# Patient Record
Sex: Female | Born: 1970 | Race: White | Hispanic: No | Marital: Married | Smoking: Never smoker
Health system: Southern US, Community
[De-identification: ages and names within clinical notes are randomized; demographics above are authoritative.]

## PROBLEM LIST (undated history)

## (undated) DIAGNOSIS — Z808 Family history of malignant neoplasm of other organs or systems: Secondary | ICD-10-CM

## (undated) DIAGNOSIS — Z803 Family history of malignant neoplasm of breast: Secondary | ICD-10-CM

## (undated) DIAGNOSIS — Z800 Family history of malignant neoplasm of digestive organs: Secondary | ICD-10-CM

## (undated) DIAGNOSIS — Q510 Agenesis and aplasia of uterus: Secondary | ICD-10-CM

## (undated) DIAGNOSIS — J45909 Unspecified asthma, uncomplicated: Secondary | ICD-10-CM

## (undated) HISTORY — DX: Family history of malignant neoplasm of digestive organs: Z80.0

## (undated) HISTORY — DX: Family history of malignant neoplasm of other organs or systems: Z80.8

## (undated) HISTORY — DX: Family history of malignant neoplasm of breast: Z80.3

---

## 2020-04-29 ENCOUNTER — Other Ambulatory Visit: Payer: Self-pay | Admitting: Physician Assistant

## 2020-04-29 DIAGNOSIS — Z1231 Encounter for screening mammogram for malignant neoplasm of breast: Secondary | ICD-10-CM

## 2020-06-07 ENCOUNTER — Ambulatory Visit: Payer: Self-pay

## 2020-08-29 ENCOUNTER — Other Ambulatory Visit: Payer: Self-pay

## 2020-08-29 ENCOUNTER — Ambulatory Visit
Admission: RE | Admit: 2020-08-29 | Discharge: 2020-08-29 | Disposition: A | Discharge status: Home / Self Care | Payer: Federal, State, Local not specified - PPO | Source: Ambulatory Visit | Attending: Physician Assistant | Admitting: Physician Assistant

## 2020-08-29 DIAGNOSIS — Z1231 Encounter for screening mammogram for malignant neoplasm of breast: Secondary | ICD-10-CM

## 2020-08-29 IMAGING — MG DIGITAL SCREENING BILAT W/ CAD
4 series · 4 of 4 positions shown · non-contrast
Comparison: None.

CLINICAL DATA: Screening.

EXAM:
DIGITAL SCREENING BILATERAL MAMMOGRAM WITH CAD
TECHNIQUE: Bilateral screening digital craniocaudal and mediolateral oblique
mammograms were obtained. The images were evaluated with
computer-aided detection.

[R CC]
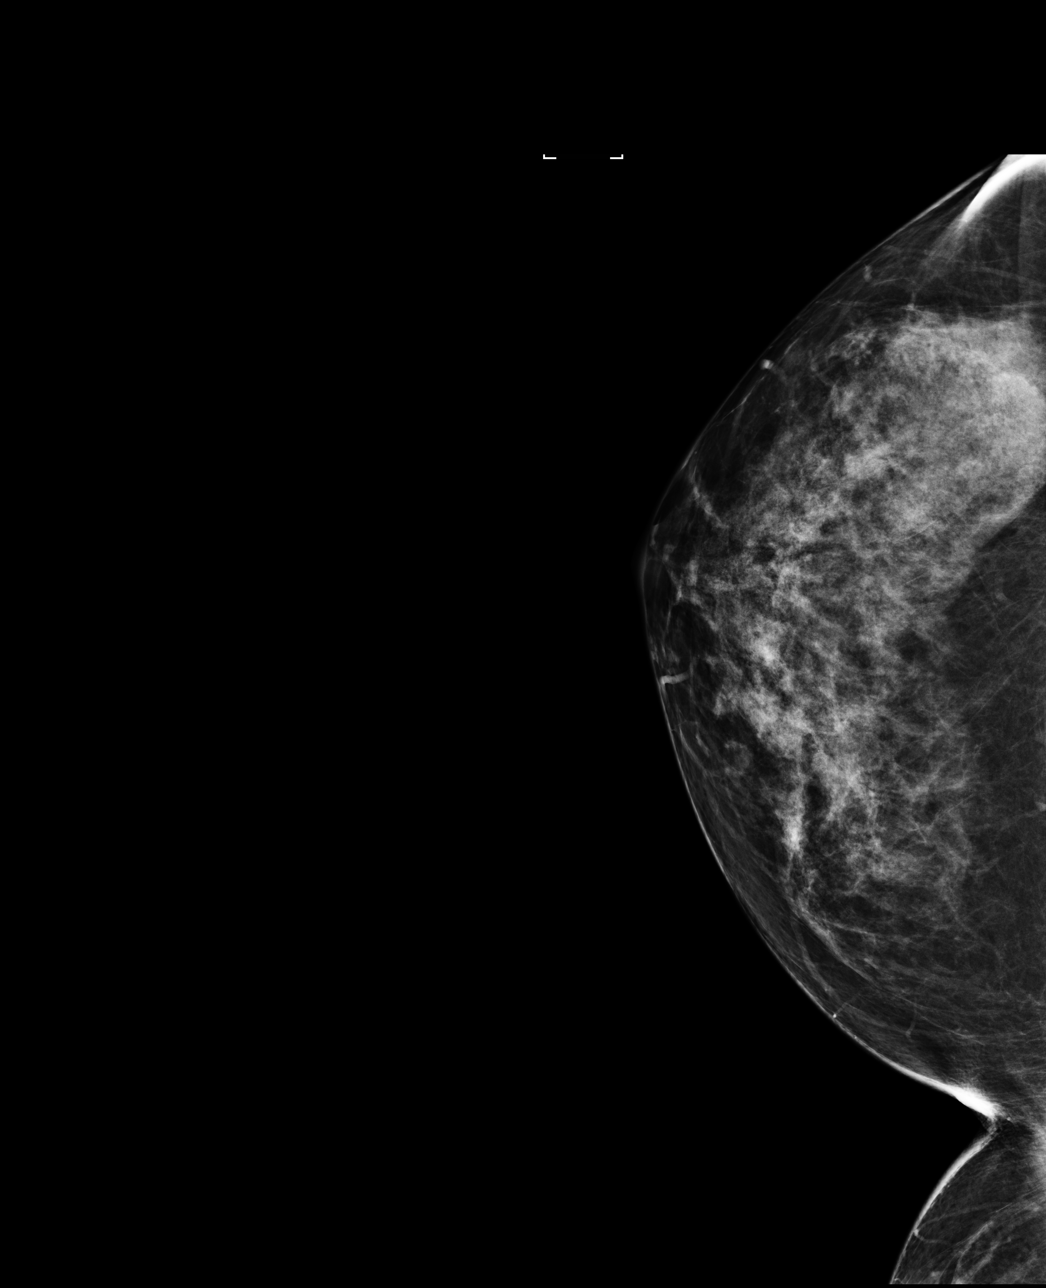

[L MLO]
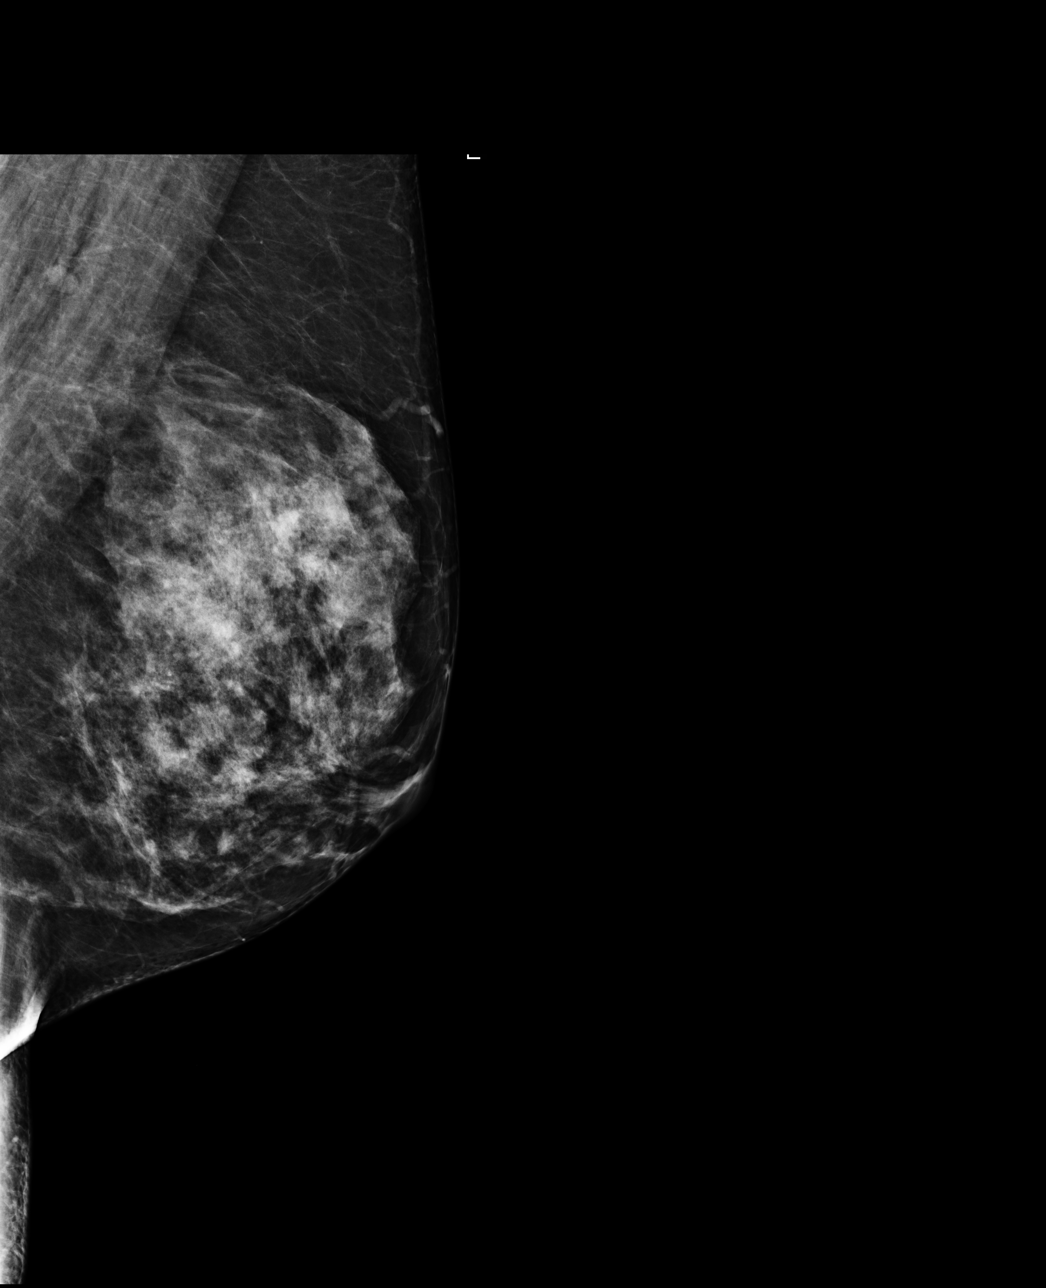

[R MLO]
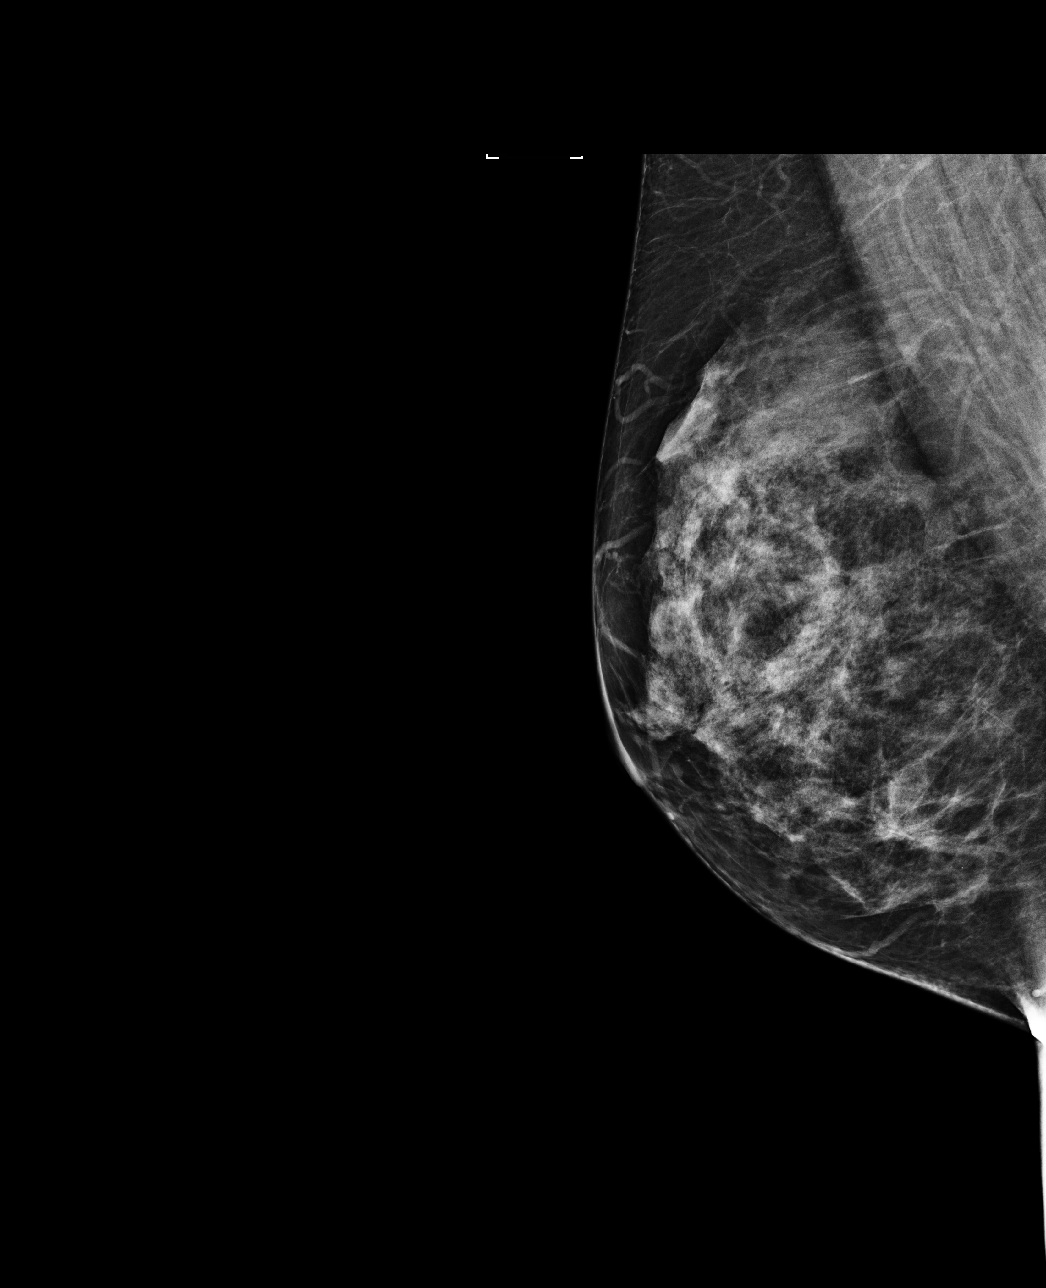

[L CC]
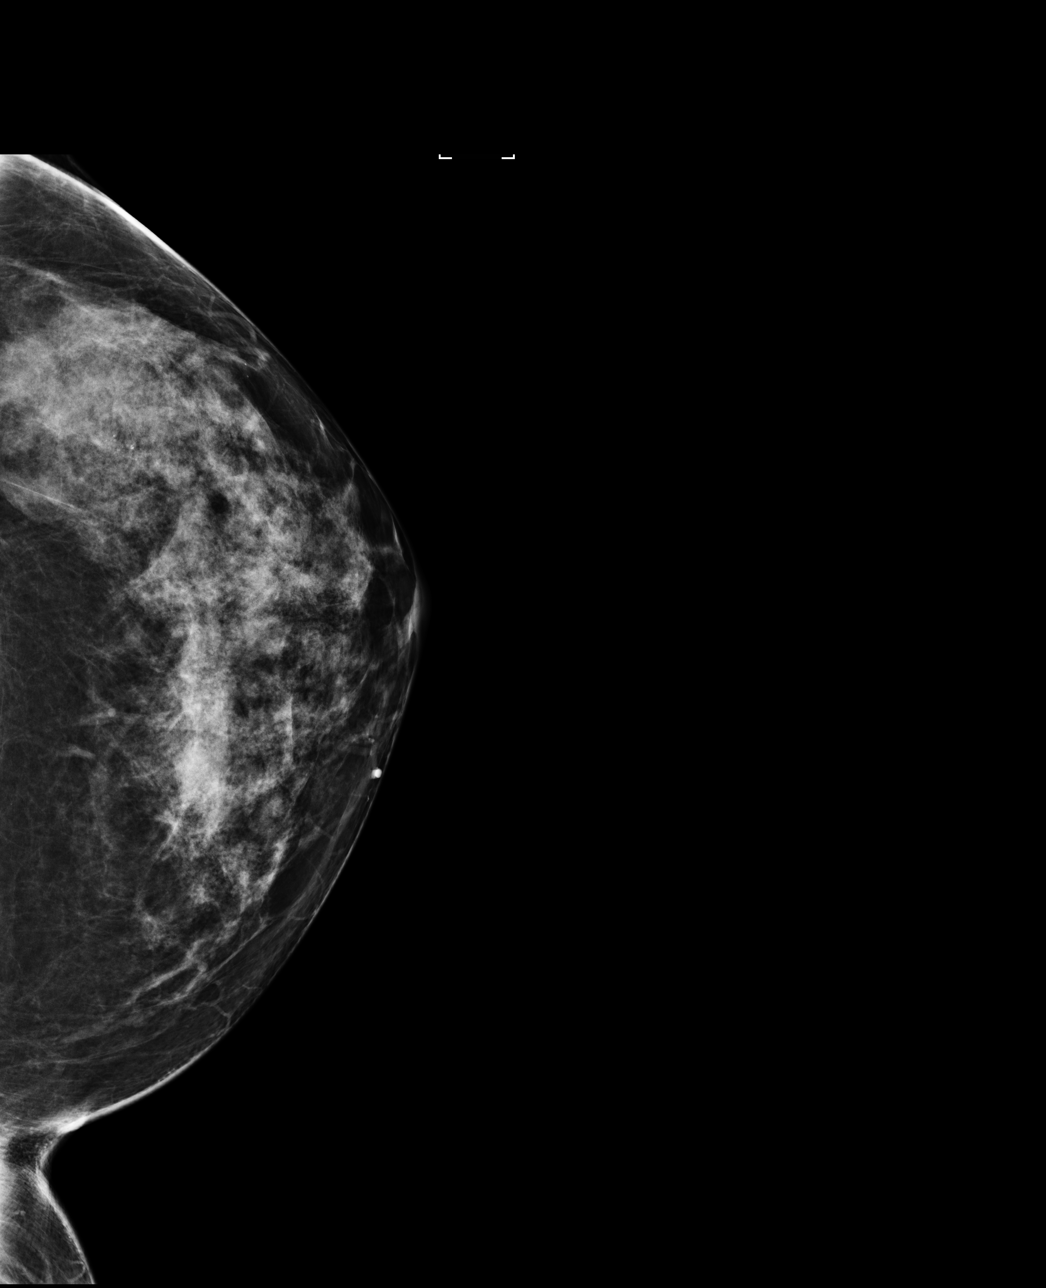

[4 of 4 positions shown; findings below may reference images not displayed]

ACR Breast Density Category c: The breast tissue is heterogeneously
dense, which may obscure small masses.
FINDINGS: In the left breast, calcifications warrant further evaluation. In
the right breast, no findings suspicious for malignancy.
IMPRESSION: Further evaluation is suggested for calcifications in the left
breast.

RECOMMENDATION:
Diagnostic mammogram of the left breast. (Code:[OS])

The patient will be contacted regarding the findings, and additional
imaging will be scheduled.

BI-RADS CATEGORY  0: Incomplete. Need additional imaging evaluation
and/or prior mammograms for comparison.

## 2020-08-31 ENCOUNTER — Other Ambulatory Visit: Payer: Self-pay | Admitting: Physician Assistant

## 2020-08-31 DIAGNOSIS — R928 Other abnormal and inconclusive findings on diagnostic imaging of breast: Secondary | ICD-10-CM

## 2020-09-15 ENCOUNTER — Other Ambulatory Visit: Payer: Self-pay

## 2020-09-15 ENCOUNTER — Other Ambulatory Visit: Payer: Self-pay | Admitting: Physician Assistant

## 2020-09-15 ENCOUNTER — Ambulatory Visit
Admission: RE | Admit: 2020-09-15 | Discharge: 2020-09-15 | Disposition: A | Discharge status: Home / Self Care | Payer: Federal, State, Local not specified - PPO | Source: Ambulatory Visit | Attending: Physician Assistant | Admitting: Physician Assistant

## 2020-09-15 DIAGNOSIS — R928 Other abnormal and inconclusive findings on diagnostic imaging of breast: Secondary | ICD-10-CM

## 2020-09-15 DIAGNOSIS — R921 Mammographic calcification found on diagnostic imaging of breast: Secondary | ICD-10-CM

## 2020-09-15 IMAGING — MG DIGITAL DIAGNOSTIC UNILAT LEFT W/ CAD
3 series · 3 of 3 positions shown · non-contrast
Comparison: Previous exam(s).

CLINICAL DATA: Patient recalled from screening for left breast
calcifications.

EXAM:
DIGITAL DIAGNOSTIC UNILATERAL LEFT MAMMOGRAM WITH CAD
TECHNIQUE: Left digital diagnostic mammography was performed. Mammographic
images were processed with CAD.

[L ML (1 of 3)]
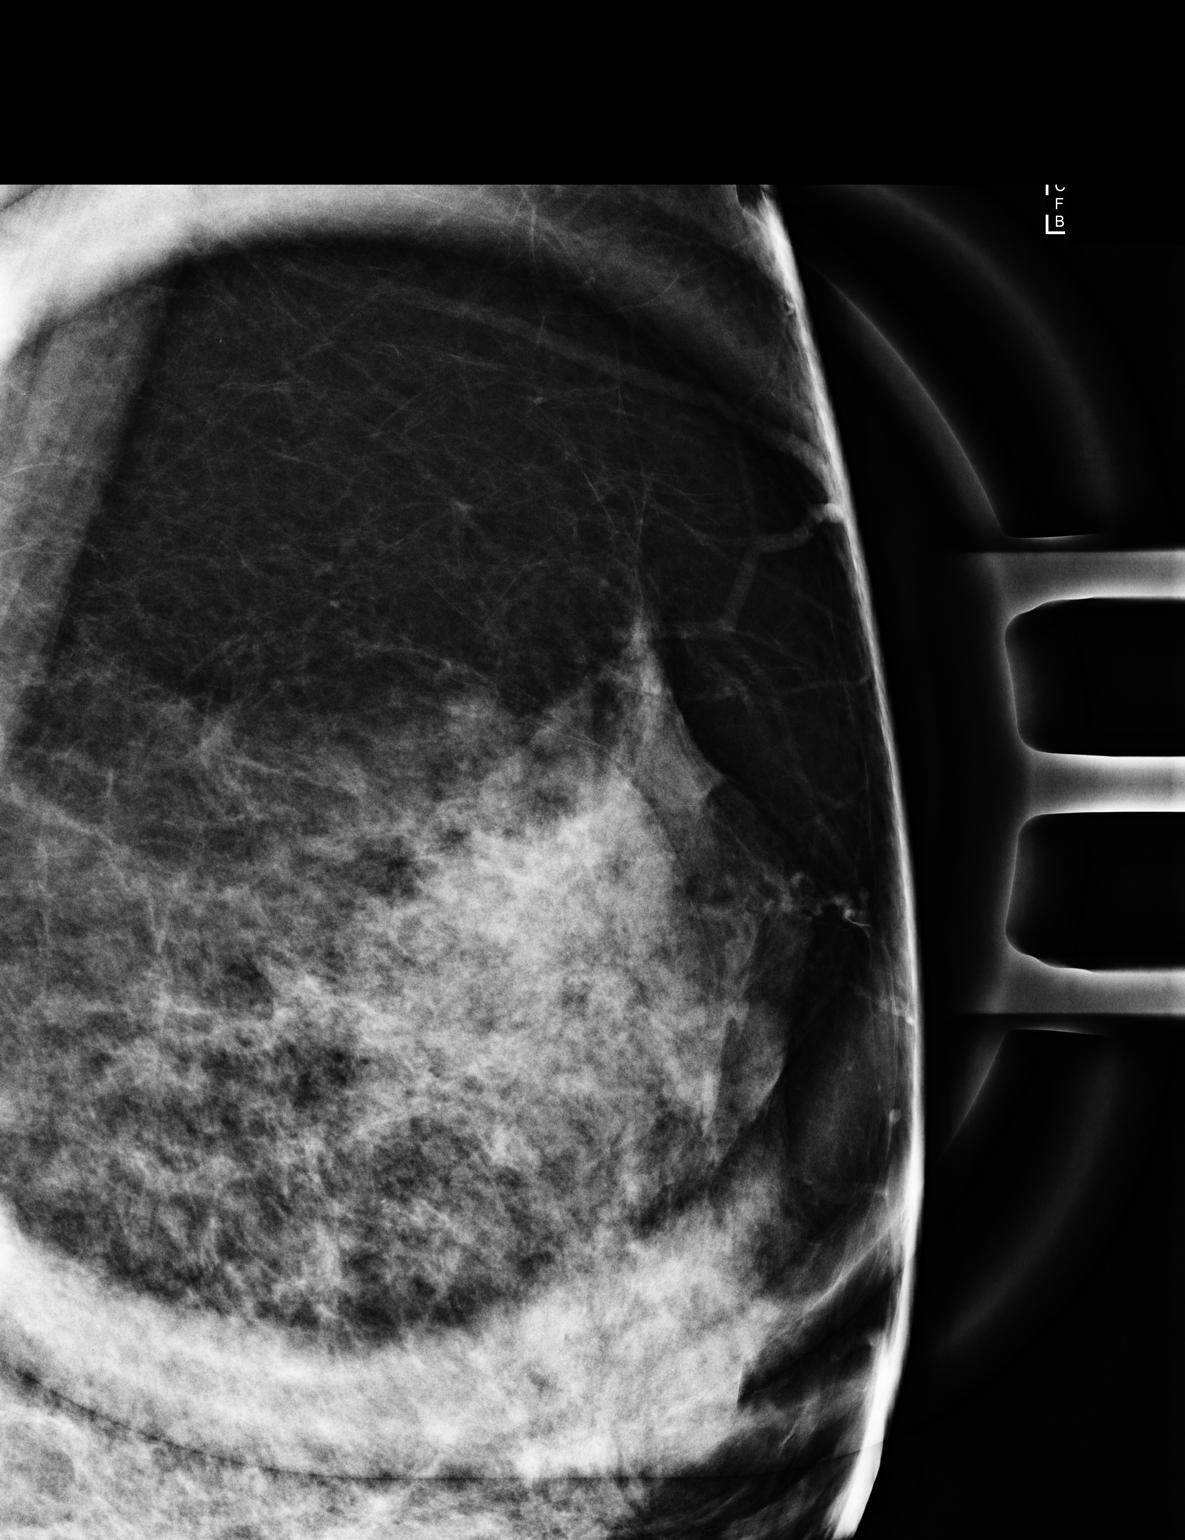

[L ML (2 of 3)]
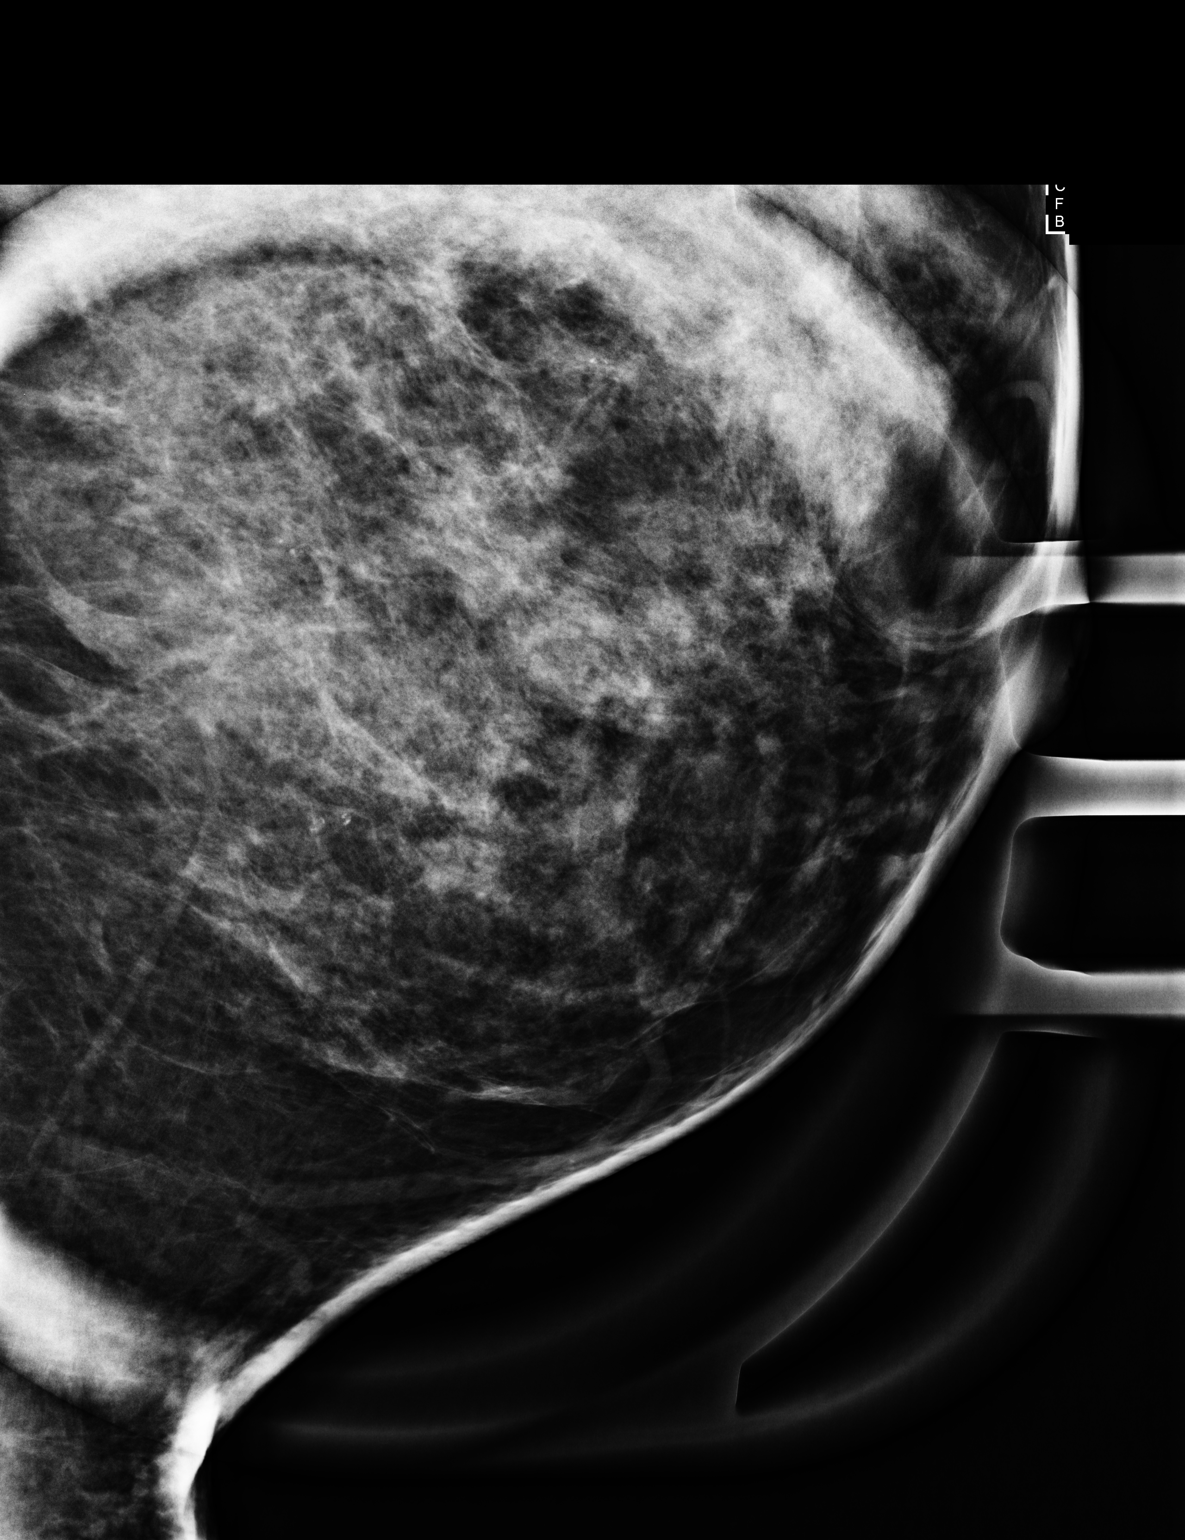

[L ML (3 of 3)]
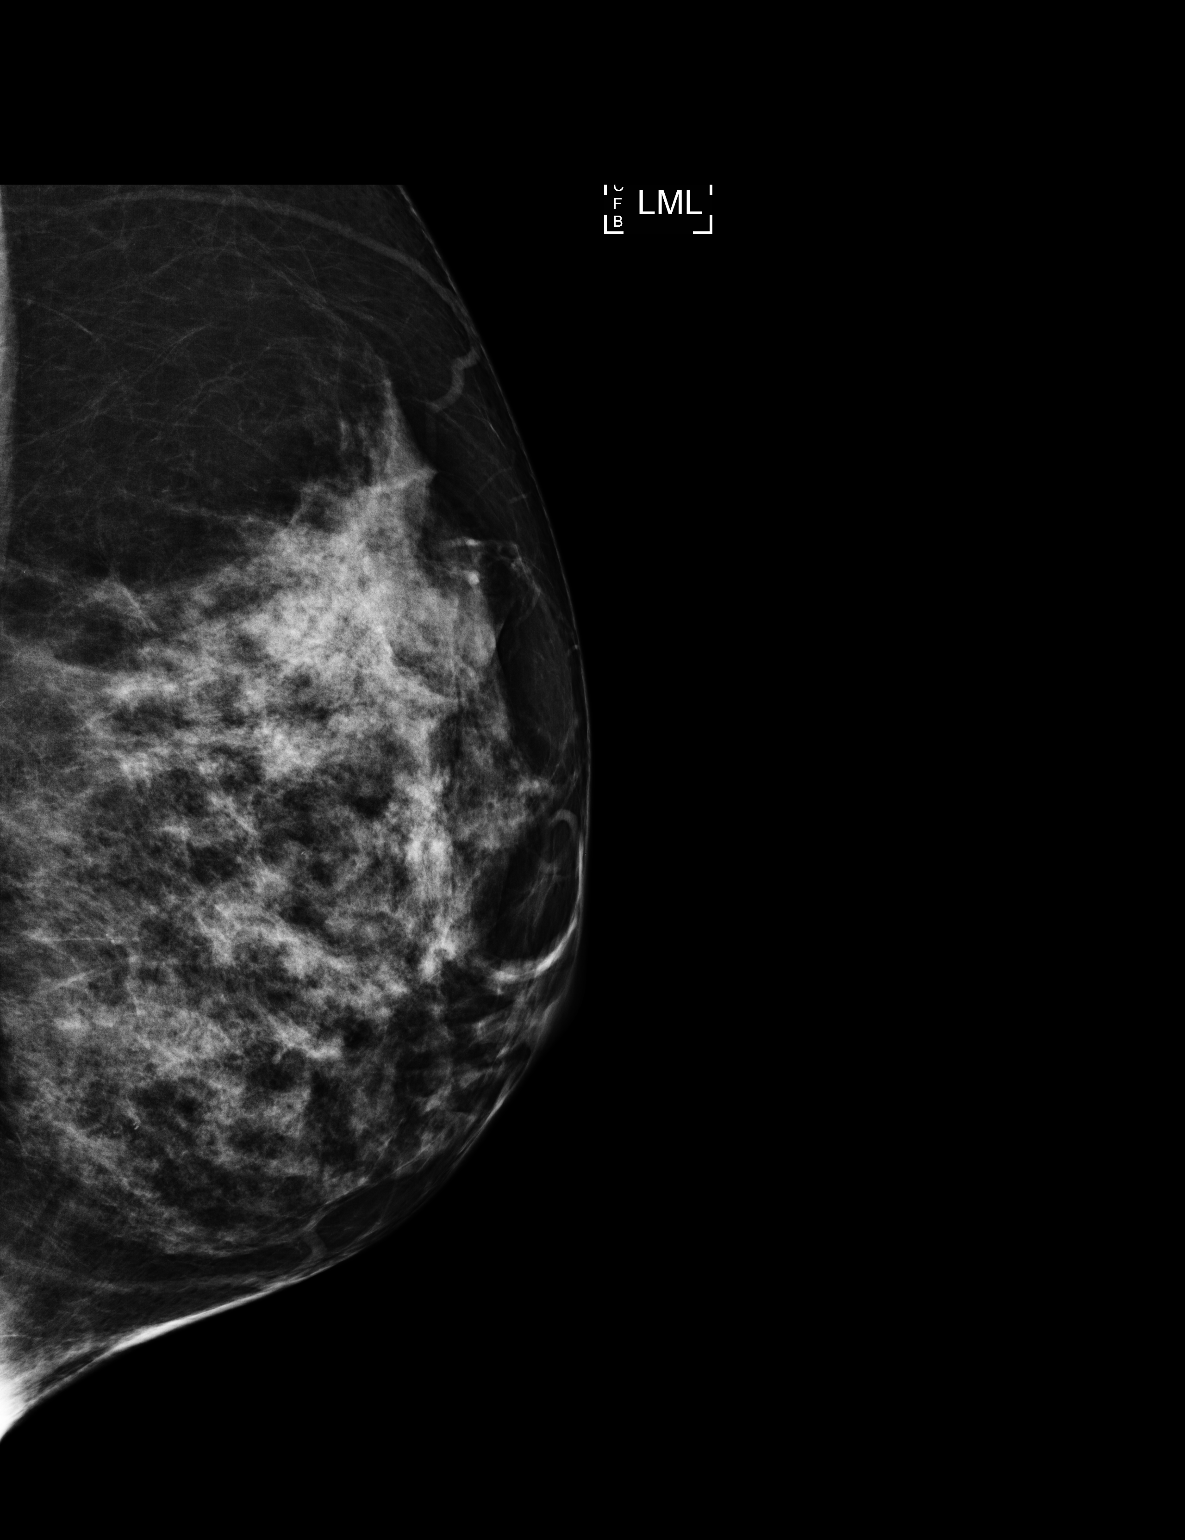

[3 of 3 positions shown; findings below may reference images not displayed]

ACR Breast Density Category c: The breast tissue is heterogeneously
dense, which may obscure small masses.
FINDINGS: Magnification cc and true lateral views of the left breast were
obtained. Within the outer left breast middle depth there is a 5 mm
group of coarse heterogeneous calcifications. Within the lower outer
left breast middle depth there is an additional 5 mm group of coarse
heterogeneous calcifications.
IMPRESSION: There are 2 indeterminate groups of calcifications within the left
breast demonstrated best on the true lateral view.

RECOMMENDATION:
Stereotactic guided core needle biopsy of both groups of
indeterminate left breast calcifications.

I have discussed the findings and recommendations with the patient.
If applicable, a reminder letter will be sent to the patient
regarding the next appointment.

BI-RADS CATEGORY  4: Suspicious.

## 2020-09-19 ENCOUNTER — Other Ambulatory Visit: Payer: Self-pay

## 2020-09-19 ENCOUNTER — Ambulatory Visit
Admission: RE | Admit: 2020-09-19 | Discharge: 2020-09-19 | Disposition: A | Discharge status: Home / Self Care | Payer: Federal, State, Local not specified - PPO | Source: Ambulatory Visit | Attending: Physician Assistant | Admitting: Physician Assistant

## 2020-09-19 DIAGNOSIS — R921 Mammographic calcification found on diagnostic imaging of breast: Secondary | ICD-10-CM

## 2020-09-19 IMAGING — MG MM BREAST BX W LOC DEV EA AD LESION IMG BX SPEC STEREO GUIDE*L*
7 of 8 series · 7 of 8 positions shown · non-contrast
Comparison: Previous exams.
COMPARISON: Previous exams.

Addendum:
CLINICAL DATA: 49-year-old with 2 groups of screening detected
indeterminate calcifications involving the LEFT breast, each group
measuring approximately 5 mm. One of the groups is located in the
LOWER OUTER QUADRANT at POSTERIOR depth and the other group is
located in the UPPER OUTER QUADRANT at MIDDLE depth.

EXAM:
LEFT BREAST STEREOTACTIC CORE NEEDLE BIOPSY x 2

[L (1 of 7)]
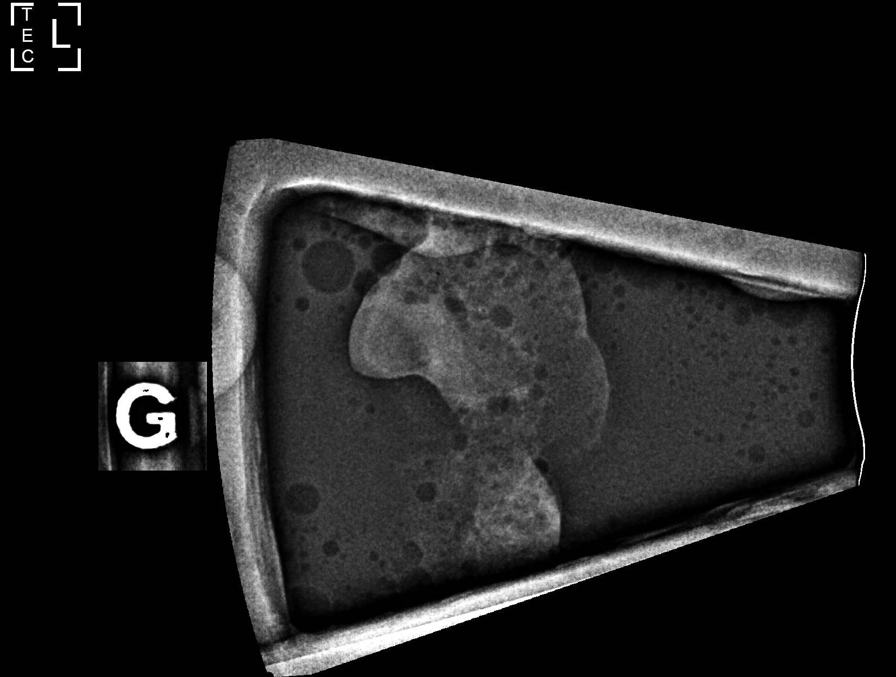

[L (2 of 7)]
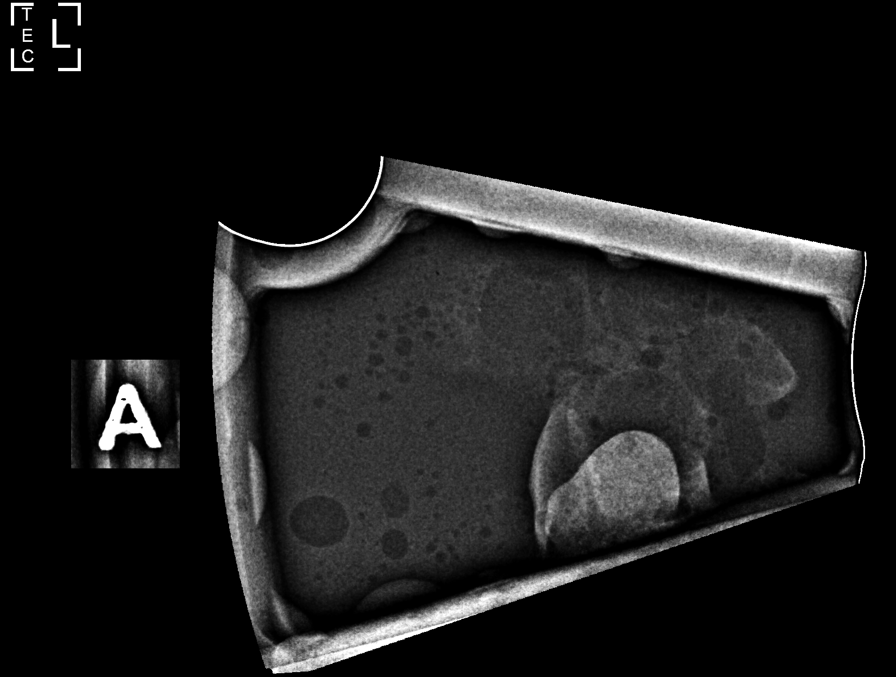

[L (3 of 7)]
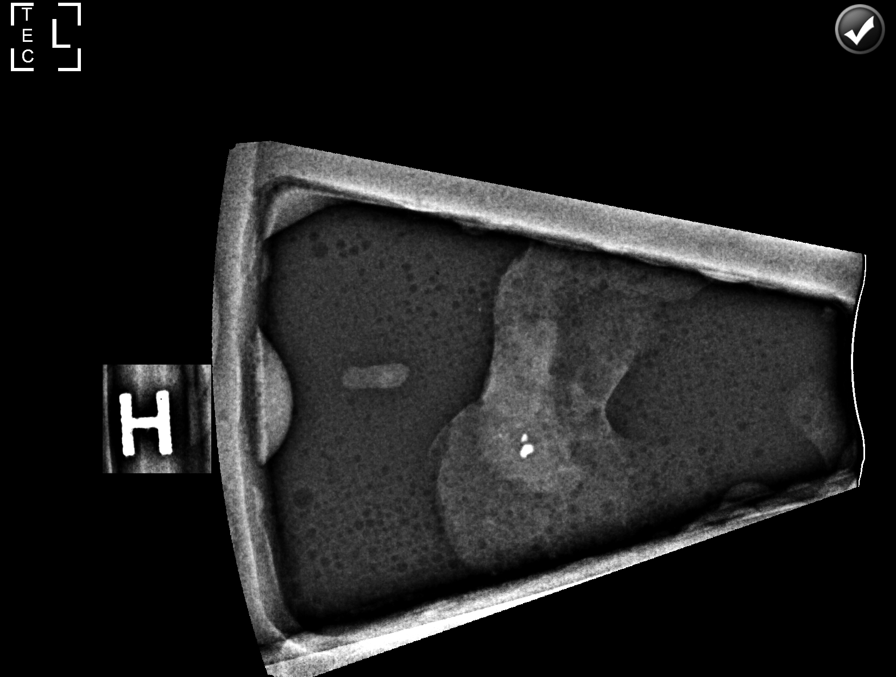

[L (4 of 7)]
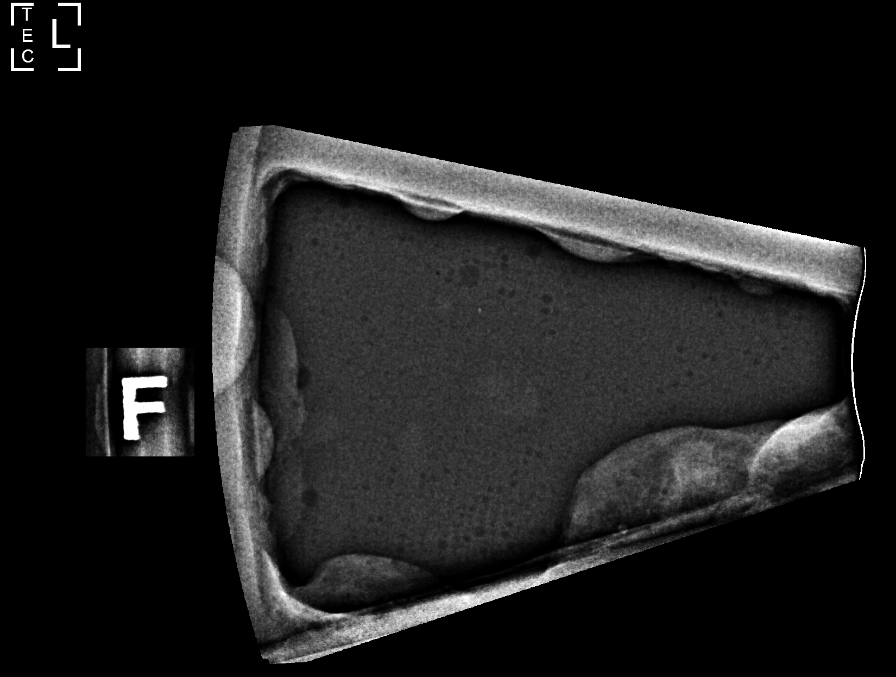

[L (5 of 7)]
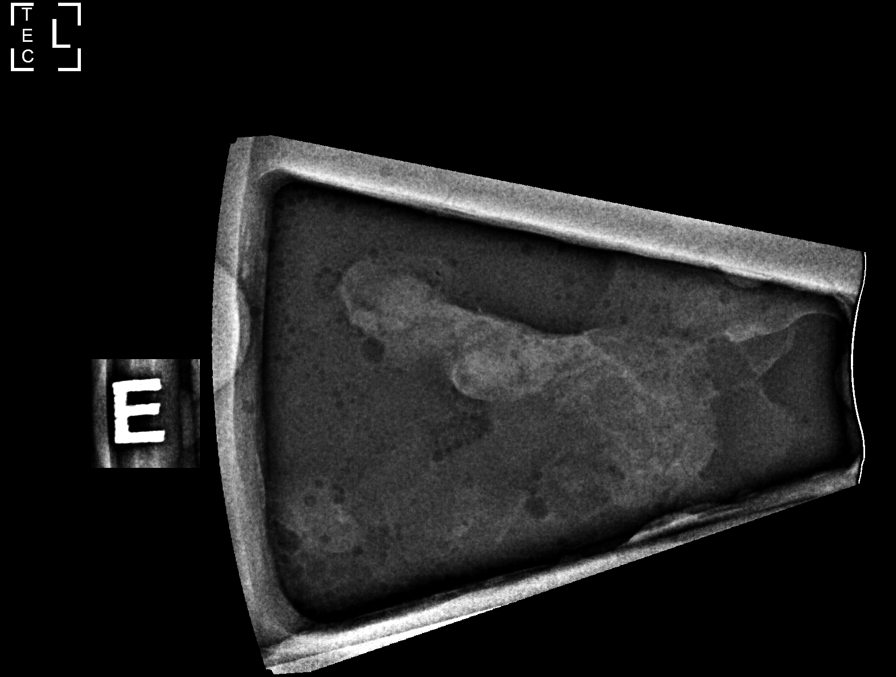

[L (6 of 7)]
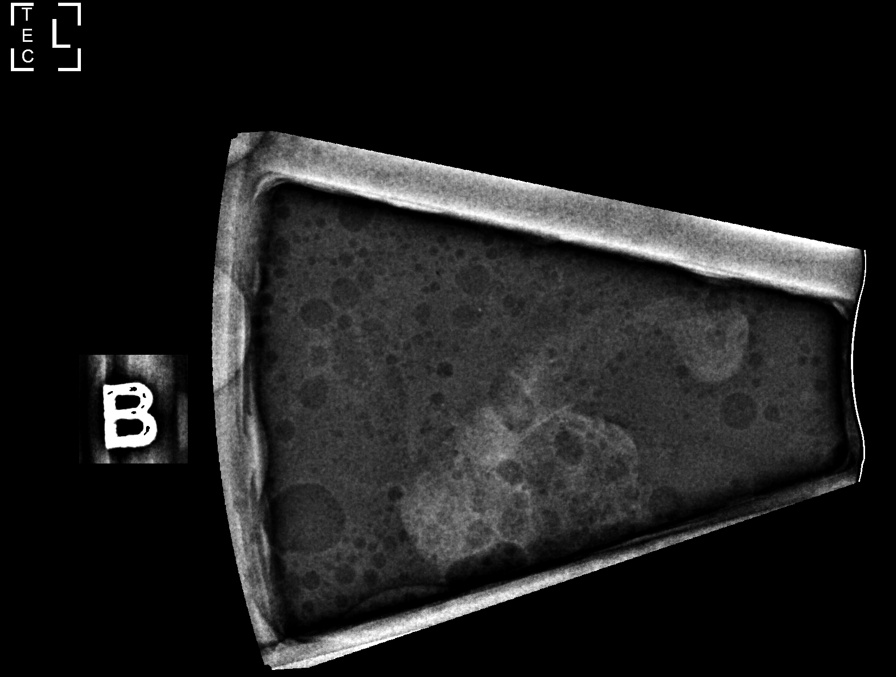

[L (7 of 7)]
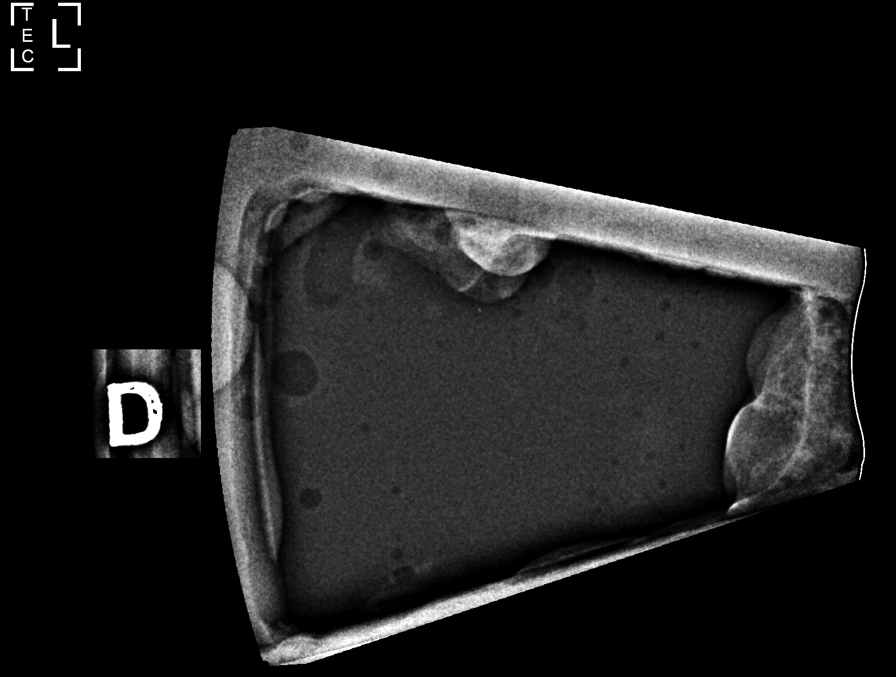

[7 of 8 positions shown; findings below may reference images not displayed]



# 1) Lesion quadrant: LOWER OUTER QUADRANT.

Using sterile technique with chlorhexidine as skin antisepsis, 1%
lidocaine and 1% lidocaine with epinephrine as local anesthetic,
under stereotactic guidance, a 9 gauge Brevera vacuum assisted
device was used to perform core needle biopsy of the calcifications
in the LOWER OUTER QUADRANT at POSTERIOR depth using a lateral
approach. Specimen radiograph was performed showing calcifications
in at least 2 of the core samples. Specimens with calcifications are
identified for pathology. At the conclusion of the procedure, a coil
shaped tissue marker clip was deployed into the biopsy cavity.

# 2) Lesion quadrant: UPPER OUTER QUADRANT.

Using sterile technique with chlorhexidine as skin antisepsis, 1%
lidocaine and 1% lidocaine with epinephrine as local anesthetic,
under stereotactic guidance, a 9 gauge Brevera vacuum assisted
device was used to perform core needle biopsy of the calcifications
in the UPPER OUTER QUADRANT MIDDLE depth using a lateral approach.
Specimen radiograph was performed showing calcifications in at least
1 of the core samples. Specimens with calcifications are identified
for pathology. At the conclusion of the procedure, an X shaped
tissue marker clip was deployed into the biopsy cavity.

Follow-up 2-view mammogram was performed and dictated separately.
IMPRESSION: Stereotactic-guided biopsy of 2 groups of indeterminate
calcifications involving the LEFT breast. No apparent complications.

ADDENDUM:
Pathology revealed MILD FIBROCYSTIC CHANGE WITH FOCAL DYSTROPHIC
CALCIFICATIONS of the Left breast, lower outer quadrant, posterior
depth. This was found to be concordant by Dr. ZEINAB.

Pathology revealed HIGH-GRADE DUCTAL CARCINOMA IN SITU WITH NECROSIS
AND CALCIFICATIONS of the Left breast, upper outer quadrant, middle
depth. This was found to be concordant by Dr. ZEINAB.

Pathology results were discussed with the patient by telephone. The
patient reported doing well after the biopsies with tenderness at
the sites. Post biopsy instructions and care were reviewed and
questions were answered. The patient was encouraged to call The
direct phone number was provided.

Surgical consultation has been arranged with Dr. ZEINAB at
[REDACTED] on [DATE].

The patient is scheduled for a Left breast stereotactic guided
biopsy for a third group of calcifications in the lower outer
quadrant on [DATE]. Further recommendations will be guided
by the results of this biopsy.

One might consider a bilateral breast MRI for further evaluation of
extent of disease given the high grade histology.

Pathology results reported by ZEINAB, RN on [DATE].



# 1) Lesion quadrant: LOWER OUTER QUADRANT.

Using sterile technique with chlorhexidine as skin antisepsis, 1%
lidocaine and 1% lidocaine with epinephrine as local anesthetic,
under stereotactic guidance, a 9 gauge Brevera vacuum assisted
device was used to perform core needle biopsy of the calcifications
in the LOWER OUTER QUADRANT at POSTERIOR depth using a lateral
approach. Specimen radiograph was performed showing calcifications
in at least 2 of the core samples. Specimens with calcifications are
identified for pathology. At the conclusion of the procedure, a coil
shaped tissue marker clip was deployed into the biopsy cavity.

# 2) Lesion quadrant: UPPER OUTER QUADRANT.

Using sterile technique with chlorhexidine as skin antisepsis, 1%
lidocaine and 1% lidocaine with epinephrine as local anesthetic,
under stereotactic guidance, a 9 gauge Brevera vacuum assisted
device was used to perform core needle biopsy of the calcifications
in the UPPER OUTER QUADRANT MIDDLE depth using a lateral approach.
Specimen radiograph was performed showing calcifications in at least
1 of the core samples. Specimens with calcifications are identified
for pathology. At the conclusion of the procedure, an X shaped
tissue marker clip was deployed into the biopsy cavity.

Follow-up 2-view mammogram was performed and dictated separately.
IMPRESSION: Stereotactic-guided biopsy of 2 groups of indeterminate
calcifications involving the LEFT breast. No apparent complications.

## 2020-09-19 IMAGING — MG MM BREAST BX W LOC DEV EA AD LESION IMG BX SPEC STEREO GUIDE*L*
3 of 8 series · 3 of 28 positions shown · non-contrast
Comparison: Previous exams.
COMPARISON: Previous exams.

Addendum:
CLINICAL DATA: 49-year-old with 2 groups of screening detected
indeterminate calcifications involving the LEFT breast, each group
measuring approximately 5 mm. One of the groups is located in the
LOWER OUTER QUADRANT at POSTERIOR depth and the other group is
located in the UPPER OUTER QUADRANT at MIDDLE depth.

EXAM:
LEFT BREAST STEREOTACTIC CORE NEEDLE BIOPSY x 2

[L LM (1 of 3)]
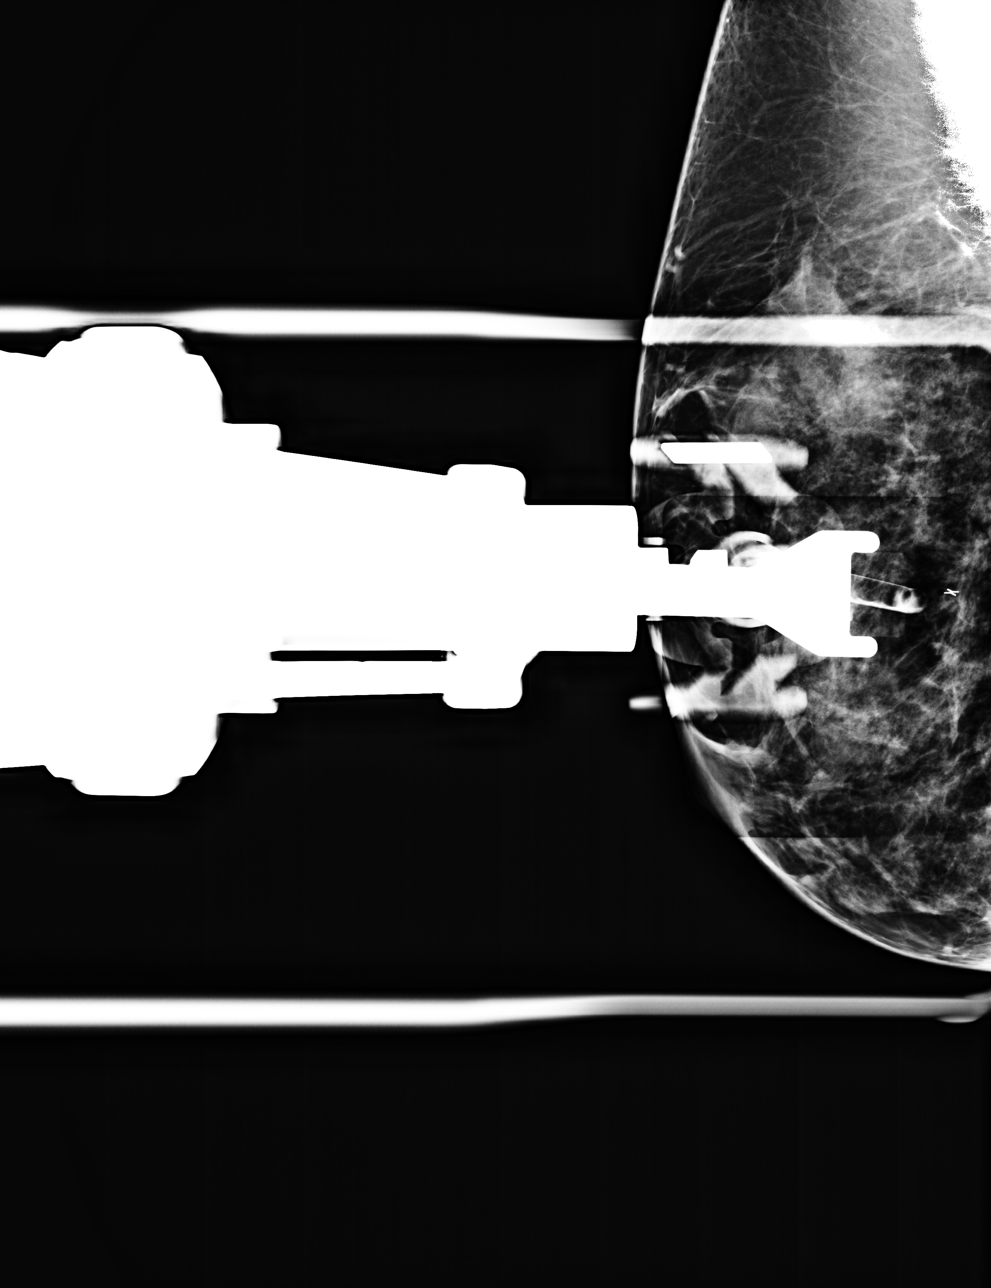

[L LM (2 of 3)]
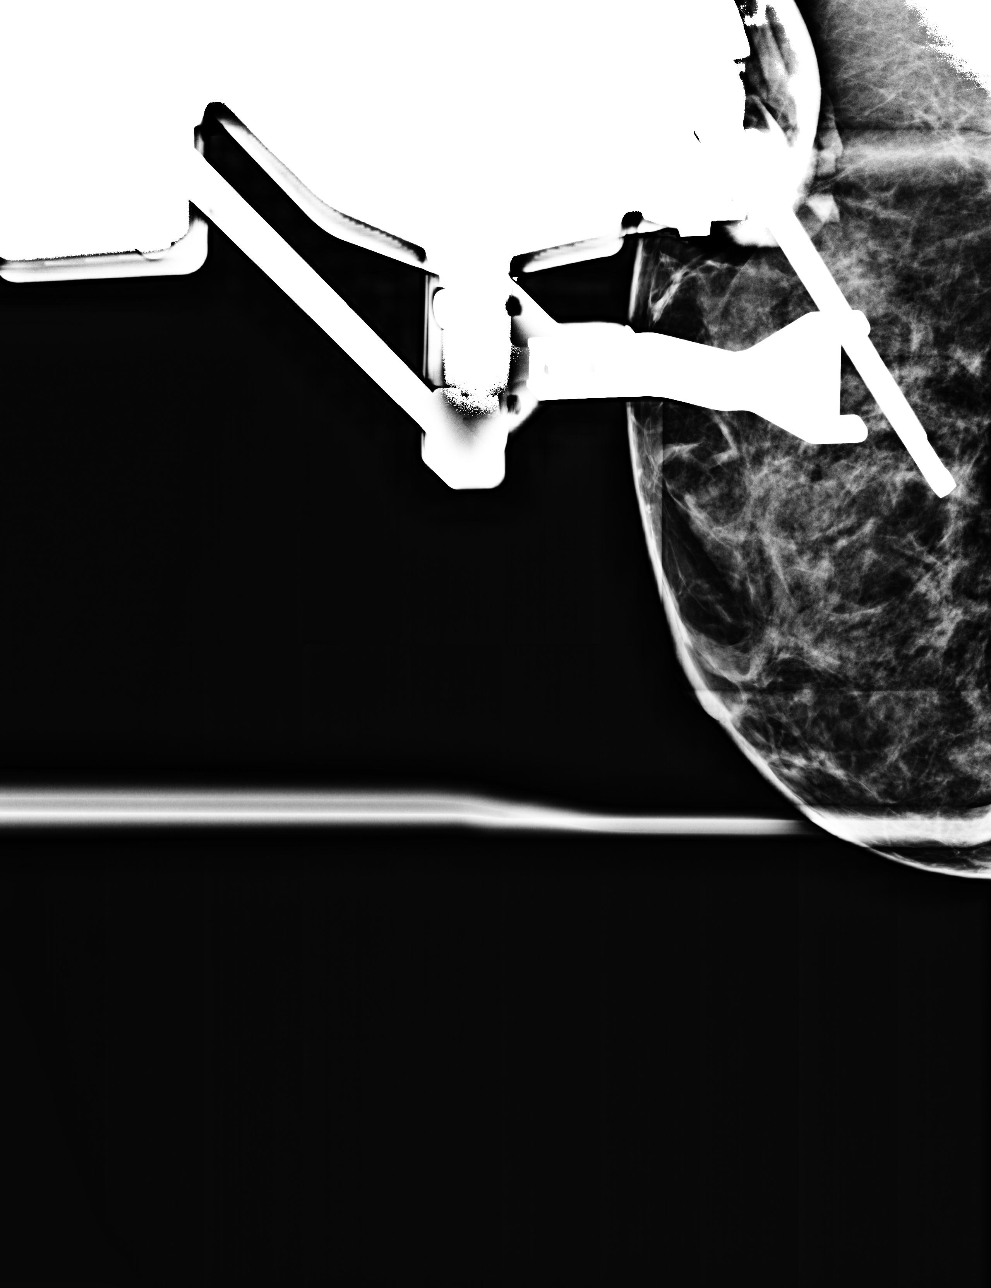

[L LM (3 of 3)]
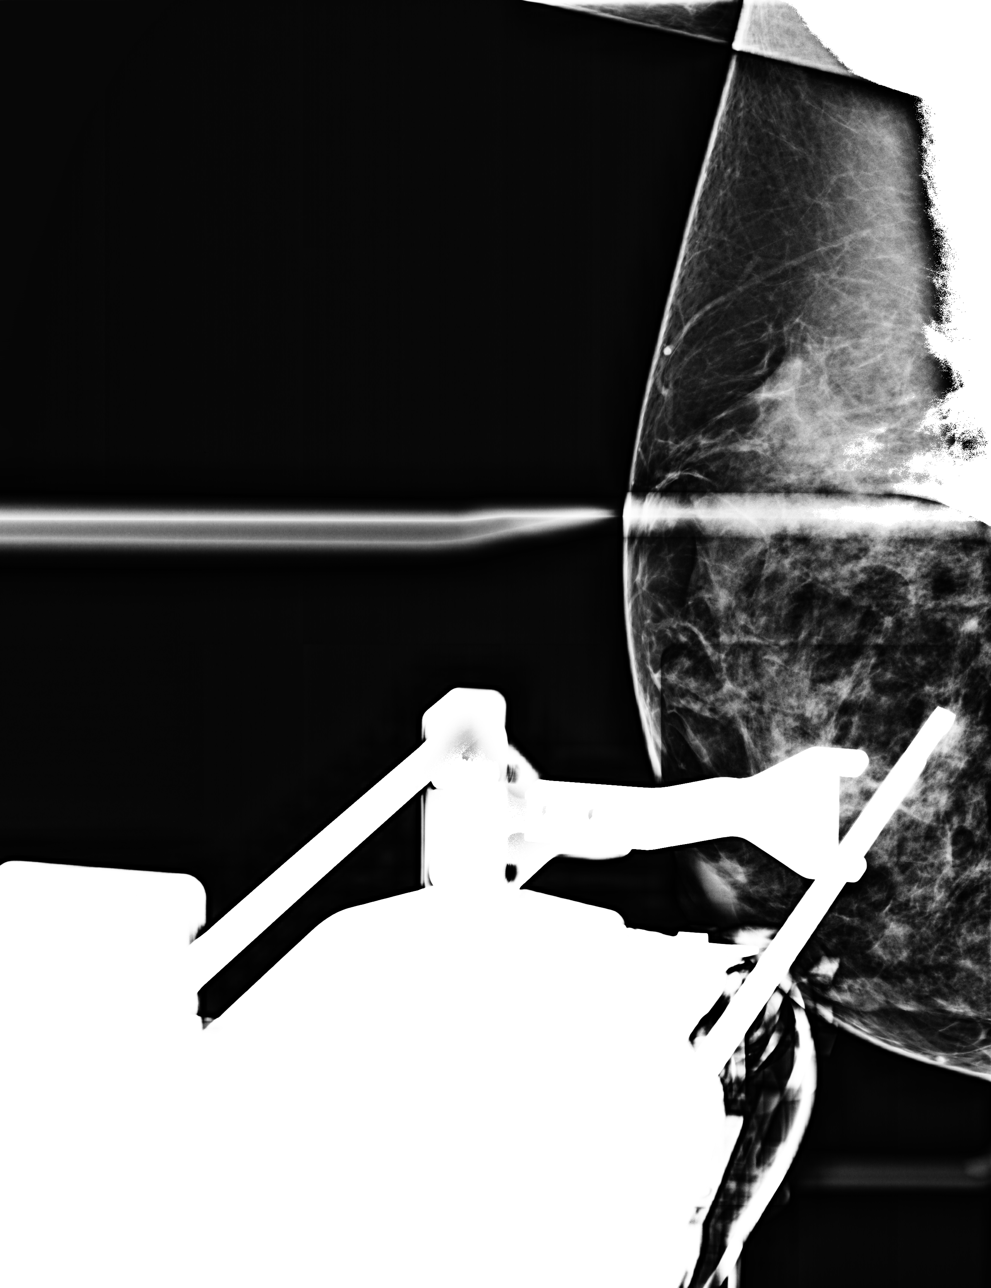

[3 of 28 positions shown; findings below may reference images not displayed]



# 1) Lesion quadrant: LOWER OUTER QUADRANT.

Using sterile technique with chlorhexidine as skin antisepsis, 1%
lidocaine and 1% lidocaine with epinephrine as local anesthetic,
under stereotactic guidance, a 9 gauge Brevera vacuum assisted
device was used to perform core needle biopsy of the calcifications
in the LOWER OUTER QUADRANT at POSTERIOR depth using a lateral
approach. Specimen radiograph was performed showing calcifications
in at least 2 of the core samples. Specimens with calcifications are
identified for pathology. At the conclusion of the procedure, a coil
shaped tissue marker clip was deployed into the biopsy cavity.

# 2) Lesion quadrant: UPPER OUTER QUADRANT.

Using sterile technique with chlorhexidine as skin antisepsis, 1%
lidocaine and 1% lidocaine with epinephrine as local anesthetic,
under stereotactic guidance, a 9 gauge Brevera vacuum assisted
device was used to perform core needle biopsy of the calcifications
in the UPPER OUTER QUADRANT MIDDLE depth using a lateral approach.
Specimen radiograph was performed showing calcifications in at least
1 of the core samples. Specimens with calcifications are identified
for pathology. At the conclusion of the procedure, an X shaped
tissue marker clip was deployed into the biopsy cavity.

Follow-up 2-view mammogram was performed and dictated separately.
IMPRESSION: Stereotactic-guided biopsy of 2 groups of indeterminate
calcifications involving the LEFT breast. No apparent complications.

ADDENDUM:
Pathology revealed MILD FIBROCYSTIC CHANGE WITH FOCAL DYSTROPHIC
CALCIFICATIONS of the Left breast, lower outer quadrant, posterior
depth. This was found to be concordant by Dr. ZEINAB.

Pathology revealed HIGH-GRADE DUCTAL CARCINOMA IN SITU WITH NECROSIS
AND CALCIFICATIONS of the Left breast, upper outer quadrant, middle
depth. This was found to be concordant by Dr. ZEINAB.

Pathology results were discussed with the patient by telephone. The
patient reported doing well after the biopsies with tenderness at
the sites. Post biopsy instructions and care were reviewed and
questions were answered. The patient was encouraged to call The
direct phone number was provided.

Surgical consultation has been arranged with Dr. ZEINAB at
[REDACTED] on [DATE].

The patient is scheduled for a Left breast stereotactic guided
biopsy for a third group of calcifications in the lower outer
quadrant on [DATE]. Further recommendations will be guided
by the results of this biopsy.

One might consider a bilateral breast MRI for further evaluation of
extent of disease given the high grade histology.

Pathology results reported by ZEINAB, RN on [DATE].



# 1) Lesion quadrant: LOWER OUTER QUADRANT.

Using sterile technique with chlorhexidine as skin antisepsis, 1%
lidocaine and 1% lidocaine with epinephrine as local anesthetic,
under stereotactic guidance, a 9 gauge Brevera vacuum assisted
device was used to perform core needle biopsy of the calcifications
in the LOWER OUTER QUADRANT at POSTERIOR depth using a lateral
approach. Specimen radiograph was performed showing calcifications
in at least 2 of the core samples. Specimens with calcifications are
identified for pathology. At the conclusion of the procedure, a coil
shaped tissue marker clip was deployed into the biopsy cavity.

# 2) Lesion quadrant: UPPER OUTER QUADRANT.

Using sterile technique with chlorhexidine as skin antisepsis, 1%
lidocaine and 1% lidocaine with epinephrine as local anesthetic,
under stereotactic guidance, a 9 gauge Brevera vacuum assisted
device was used to perform core needle biopsy of the calcifications
in the UPPER OUTER QUADRANT MIDDLE depth using a lateral approach.
Specimen radiograph was performed showing calcifications in at least
1 of the core samples. Specimens with calcifications are identified
for pathology. At the conclusion of the procedure, an X shaped
tissue marker clip was deployed into the biopsy cavity.

Follow-up 2-view mammogram was performed and dictated separately.
IMPRESSION: Stereotactic-guided biopsy of 2 groups of indeterminate
calcifications involving the LEFT breast. No apparent complications.

## 2020-09-19 IMAGING — MG MM BREAST LOCALIZATION CLIP
4 series · 6 of 12 positions shown · non-contrast
Comparison: Previous exam(s).
COMPARISON: Previous exam(s).

Addendum:
CLINICAL DATA: Confirmation of clip placement after stereotactic
tomosynthesis core needle biopsy of 2 groups of indeterminate
calcifications involving the LEFT breast.

EXAM:
2D AND TOMOSYNTHESIS DIAGNOSTIC LEFT MAMMOGRAM POST STEREOTACTIC
BIOPSY

[L LM synth-2D]
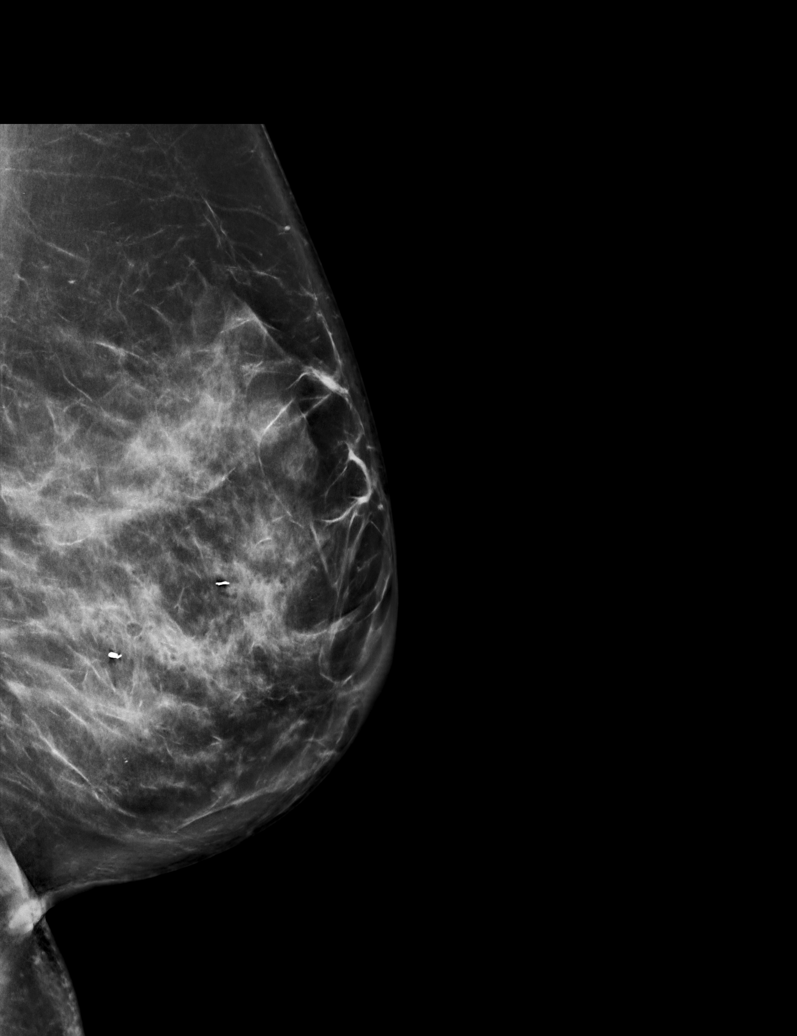

[L CC synth-2D]
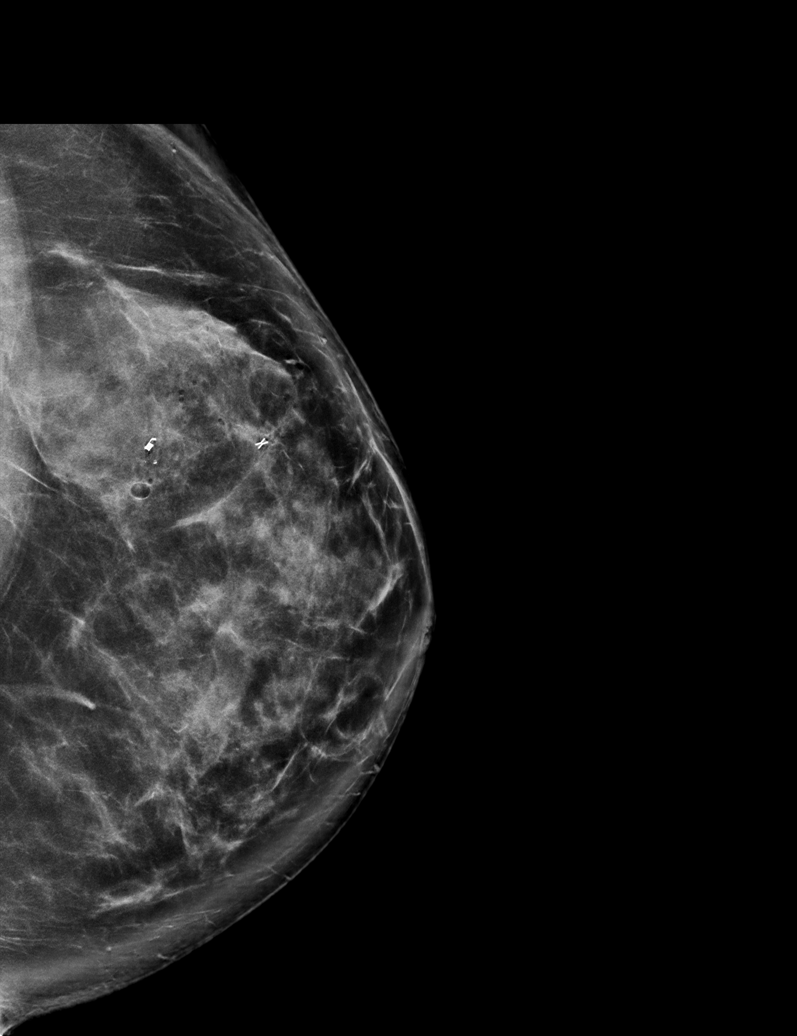

[L LM tomo · 3 of 94 frames shown]
[frame 31/94]
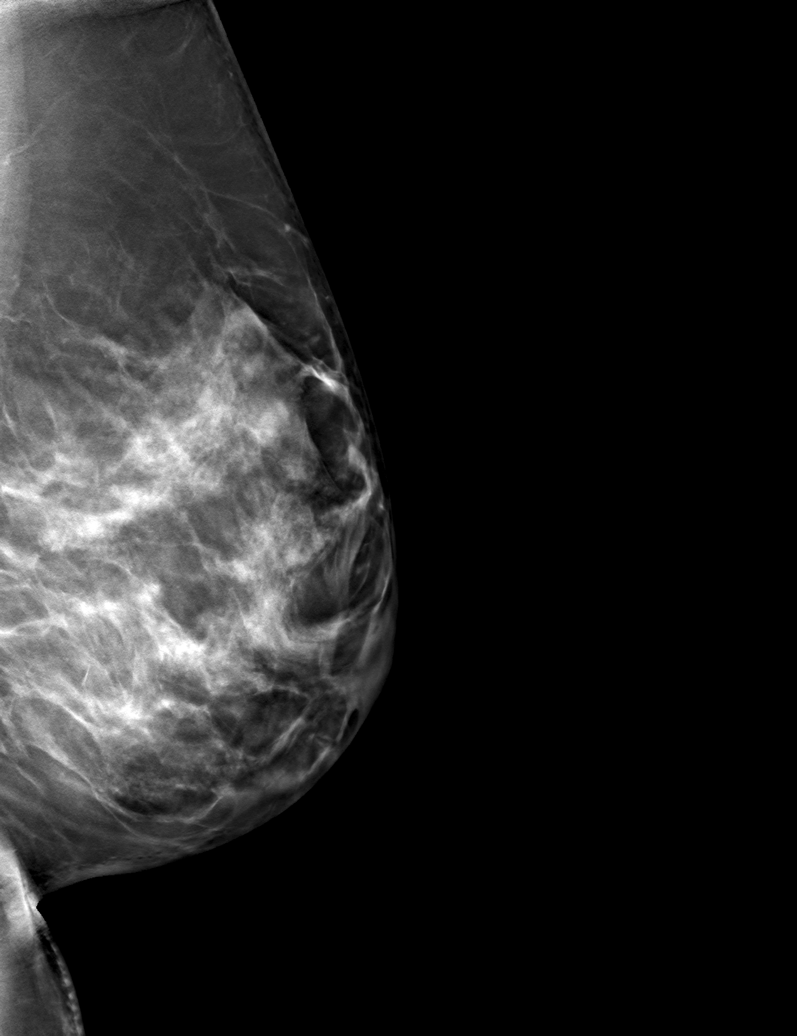
[frame 47/94]
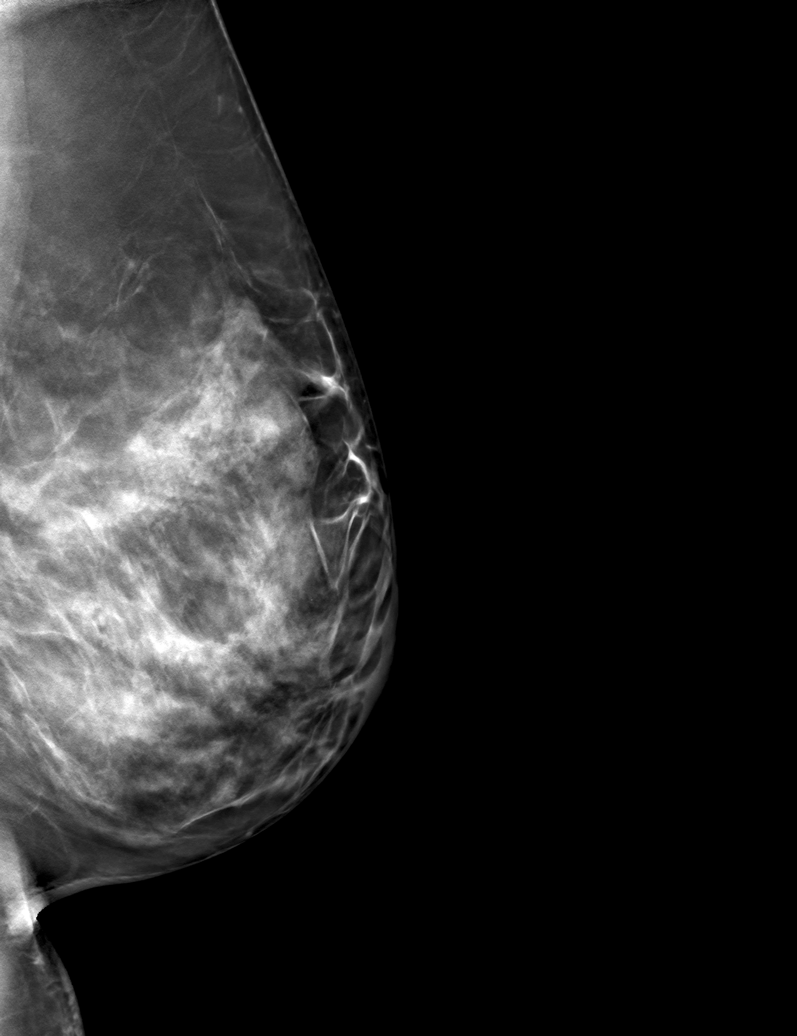
[frame 64/94]
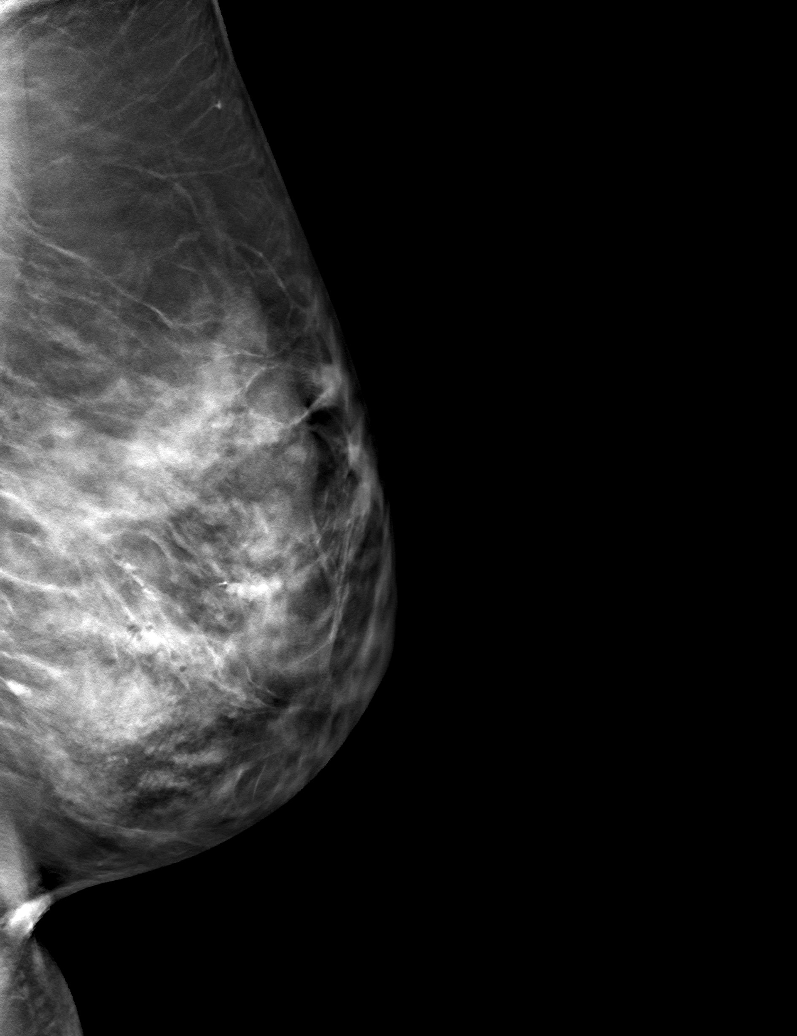

[L CC tomo · tomo slice 49/97.0]
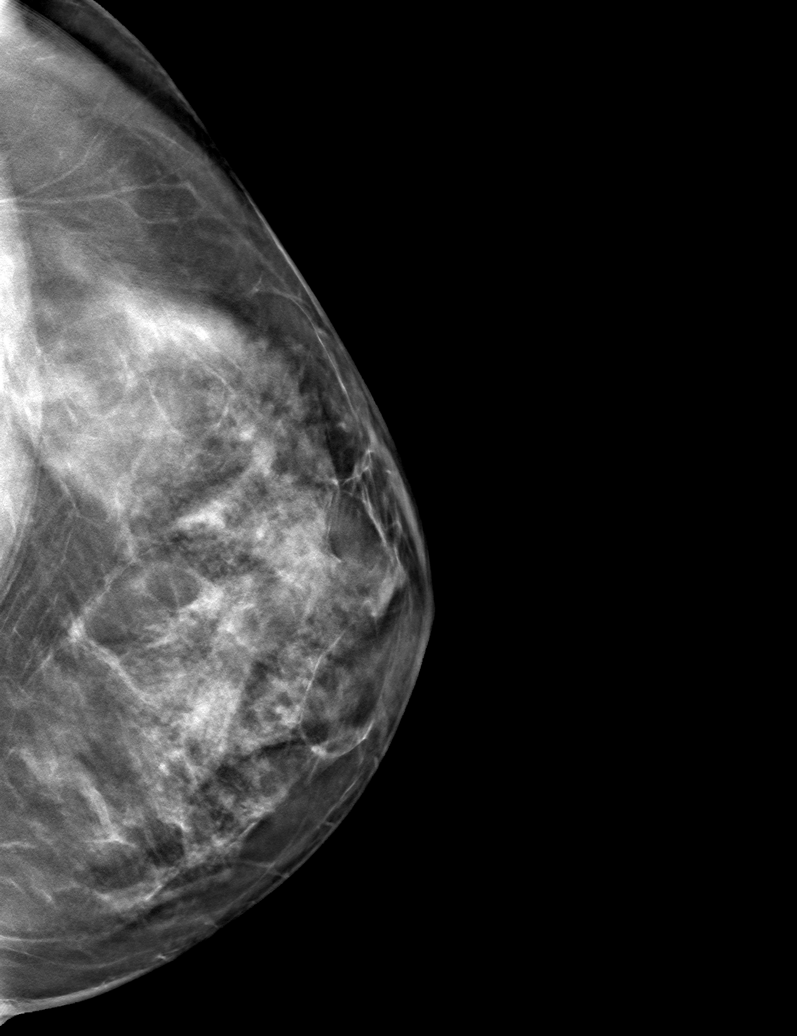

[6 of 12 positions shown; findings below may reference images not displayed]

FINDINGS: Tomosynthesis and synthesized full field CC and mediolateral images
were obtained following stereotactic tomosynthesis guided biopsy of
2 groups of calcifications involving the LEFT breast.

The coil shaped biopsy marking clip migrated approximately 2 cm
SUPERIOR to the calcifications in the LOWER OUTER QUADRANT at
POSTERIOR depth, the calcifications were present in the core
samples. There are residual calcifications in the LOWER OUTER
QUADRANT at POSTERIOR depth.

The X shaped tissue marking clip is appropriately position at the
site of the biopsied calcifications in the UPPER OUTER quadrant at
MIDDLE depth.

The clips are separated by approximately 2.4 cm. The residual
calcifications in the LOWER OUTER QUADRANT at POSTERIOR depth are
approximately 4 cm from the X shaped clip.

Expected post biopsy changes are present at both sites without
evidence of hematoma.
IMPRESSION: 1. Approximate 2 cm SUPERIOR migration of the coil shaped tissue
marker clip relative to the calcifications in the LOWER OUTER
QUADRANT at POSTERIOR depth. There are residual calcifications at
the site.
2. Appropriate positioning of the X shaped tissue marker clip at the
site of the biopsied calcifications in the UPPER OUTER QUADRANT
MIDDLE depth.
3. The clips are separated by approximately 2.4 cm. The residual
calcifications in the LOWER OUTER QUADRANT are approximately 4 cm
from the X shaped clip.

Final Assessment: Post Procedure Mammograms for Marker Placement

ADDENDUM:
After reviewing the images, I believe that the coil shaped tissue
marker clip did not migrate and a third group calcifications in the
LOWER OUTER QUADRANT were sampled. On the CC view, this third group
of calcifications overlies the larger group of calcifications that
are more inferiorly located. The patient is scheduled to return for
biopsy of the larger group of calcifications in the LOWER OUTER
QUADRANT that are more inferiorly located.

*** End of Addendum ***
FINDINGS: Tomosynthesis and synthesized full field CC and mediolateral images
were obtained following stereotactic tomosynthesis guided biopsy of
2 groups of calcifications involving the LEFT breast.

The coil shaped biopsy marking clip migrated approximately 2 cm
SUPERIOR to the calcifications in the LOWER OUTER QUADRANT at
POSTERIOR depth, the calcifications were present in the core
samples. There are residual calcifications in the LOWER OUTER
QUADRANT at POSTERIOR depth.

The X shaped tissue marking clip is appropriately position at the
site of the biopsied calcifications in the UPPER OUTER quadrant at
MIDDLE depth.

The clips are separated by approximately 2.4 cm. The residual
calcifications in the LOWER OUTER QUADRANT at POSTERIOR depth are
approximately 4 cm from the X shaped clip.

Expected post biopsy changes are present at both sites without
evidence of hematoma.
IMPRESSION: 1. Approximate 2 cm SUPERIOR migration of the coil shaped tissue
marker clip relative to the calcifications in the LOWER OUTER
QUADRANT at POSTERIOR depth. There are residual calcifications at
the site.
2. Appropriate positioning of the X shaped tissue marker clip at the
site of the biopsied calcifications in the UPPER OUTER QUADRANT
MIDDLE depth.
3. The clips are separated by approximately 2.4 cm. The residual
calcifications in the LOWER OUTER QUADRANT are approximately 4 cm
from the X shaped clip.

Final Assessment: Post Procedure Mammograms for Marker Placement

## 2020-09-19 IMAGING — MG MM BREAST BX W LOC DEV 1ST LESION IMAGE BX SPEC STEREO GUIDE*L*
7 of 10 series · 7 of 18 positions shown · non-contrast
Comparison: Previous exams.
COMPARISON: Previous exams.

Addendum:
CLINICAL DATA: 49-year-old with 2 groups of screening detected
indeterminate calcifications involving the LEFT breast, each group
measuring approximately 5 mm. One of the groups is located in the
LOWER OUTER QUADRANT at POSTERIOR depth and the other group is
located in the UPPER OUTER QUADRANT at MIDDLE depth.

EXAM:
LEFT BREAST STEREOTACTIC CORE NEEDLE BIOPSY x 2

[L (1 of 5)]
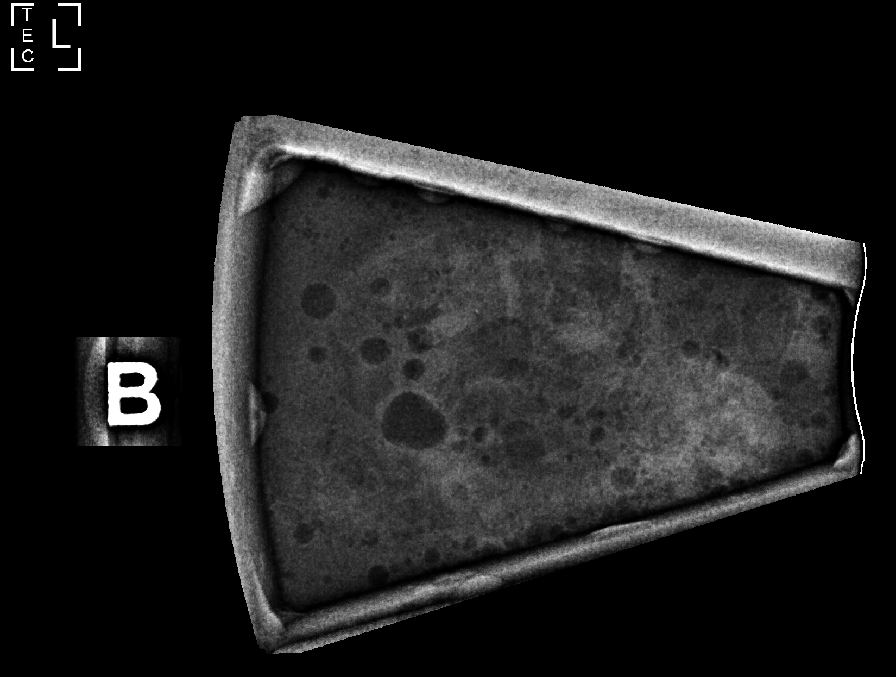

[L (2 of 5)]
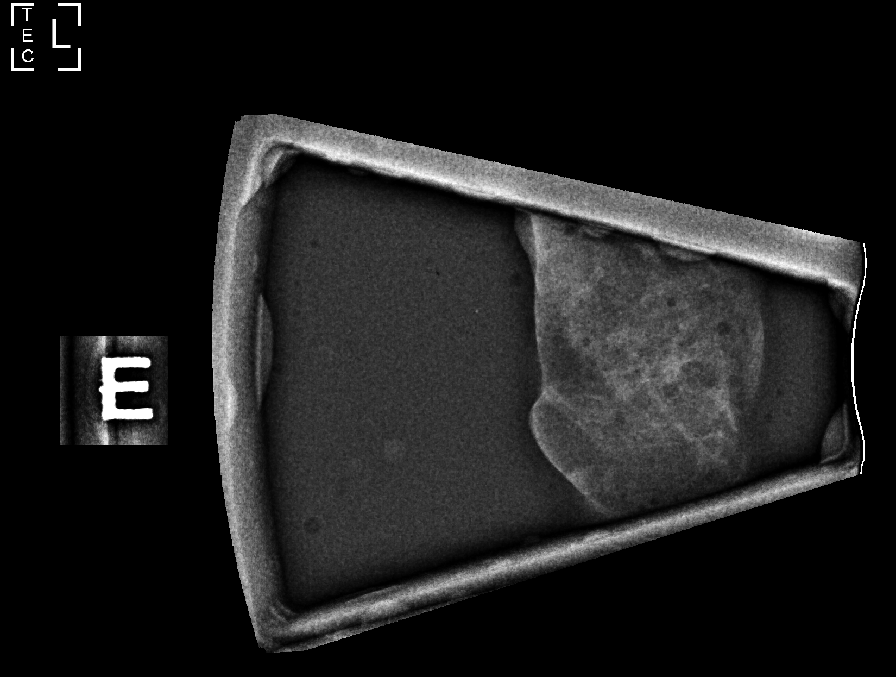

[L (3 of 5)]
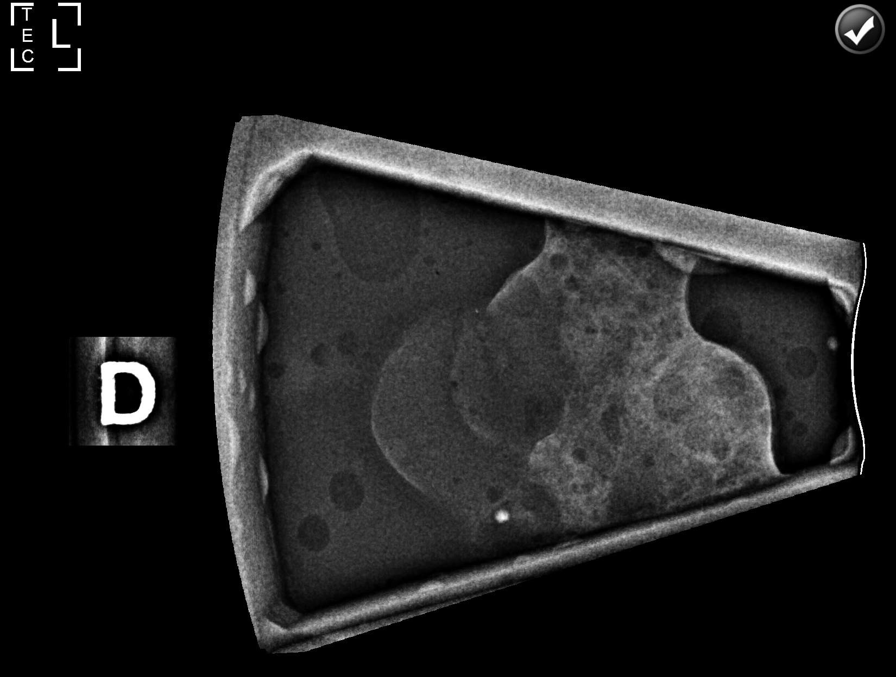

[L (4 of 5)]
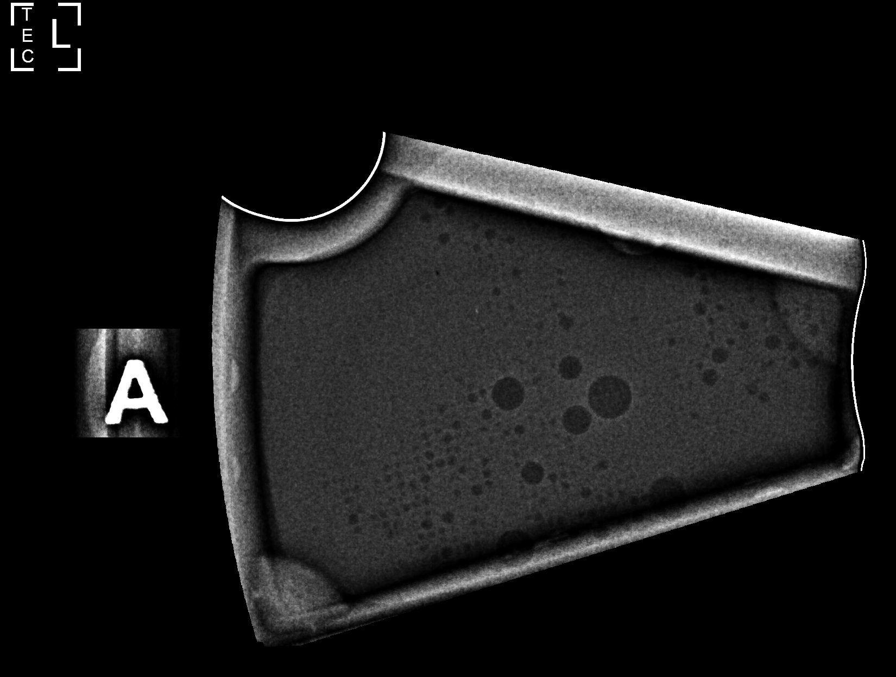

[L (5 of 5)]
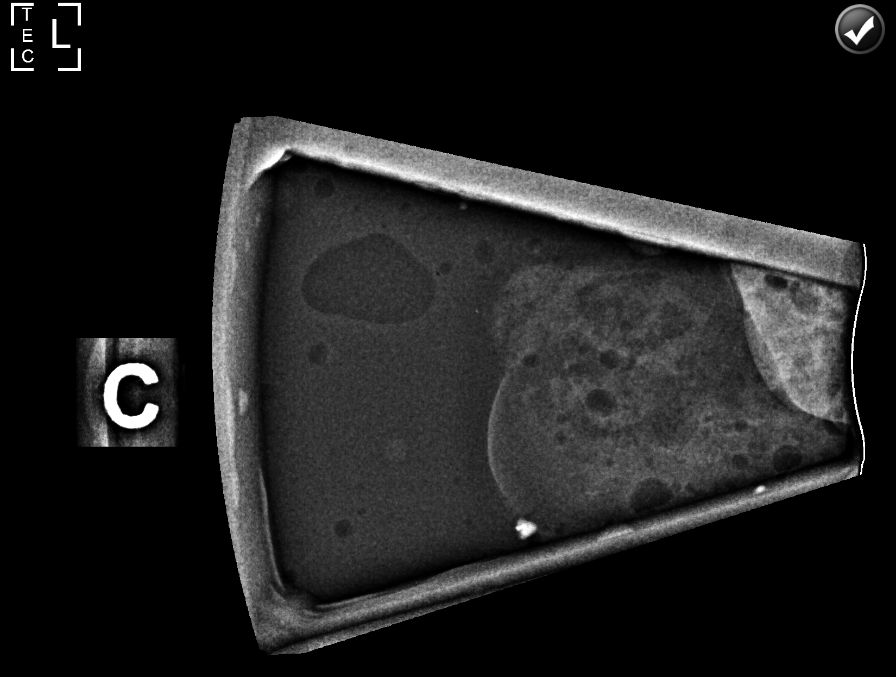

[L LM (1 of 2)]
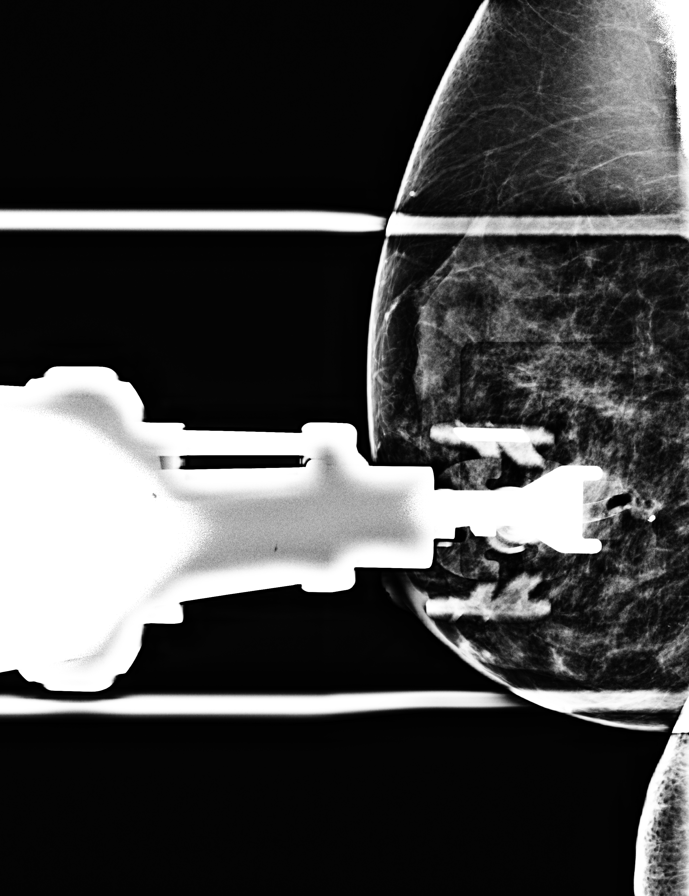

[L LM (2 of 2)]
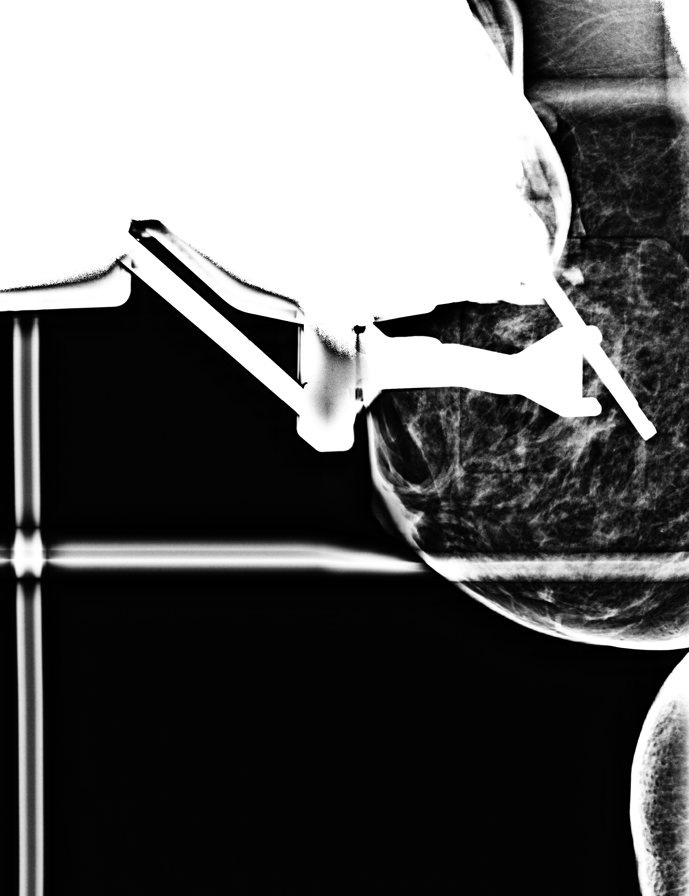

[7 of 18 positions shown; findings below may reference images not displayed]



# 1) Lesion quadrant: LOWER OUTER QUADRANT.

Using sterile technique with chlorhexidine as skin antisepsis, 1%
lidocaine and 1% lidocaine with epinephrine as local anesthetic,
under stereotactic guidance, a 9 gauge Brevera vacuum assisted
device was used to perform core needle biopsy of the calcifications
in the LOWER OUTER QUADRANT at POSTERIOR depth using a lateral
approach. Specimen radiograph was performed showing calcifications
in at least 2 of the core samples. Specimens with calcifications are
identified for pathology. At the conclusion of the procedure, a coil
shaped tissue marker clip was deployed into the biopsy cavity.

# 2) Lesion quadrant: UPPER OUTER QUADRANT.

Using sterile technique with chlorhexidine as skin antisepsis, 1%
lidocaine and 1% lidocaine with epinephrine as local anesthetic,
under stereotactic guidance, a 9 gauge Brevera vacuum assisted
device was used to perform core needle biopsy of the calcifications
in the UPPER OUTER QUADRANT MIDDLE depth using a lateral approach.
Specimen radiograph was performed showing calcifications in at least
1 of the core samples. Specimens with calcifications are identified
for pathology. At the conclusion of the procedure, an X shaped
tissue marker clip was deployed into the biopsy cavity.

Follow-up 2-view mammogram was performed and dictated separately.
IMPRESSION: Stereotactic-guided biopsy of 2 groups of indeterminate
calcifications involving the LEFT breast. No apparent complications.

ADDENDUM:
Pathology revealed MILD FIBROCYSTIC CHANGE WITH FOCAL DYSTROPHIC
CALCIFICATIONS of the Left breast, lower outer quadrant, posterior
depth. This was found to be concordant by Dr. ZEINAB.

Pathology revealed HIGH-GRADE DUCTAL CARCINOMA IN SITU WITH NECROSIS
AND CALCIFICATIONS of the Left breast, upper outer quadrant, middle
depth. This was found to be concordant by Dr. ZEINAB.

Pathology results were discussed with the patient by telephone. The
patient reported doing well after the biopsies with tenderness at
the sites. Post biopsy instructions and care were reviewed and
questions were answered. The patient was encouraged to call The
direct phone number was provided.

Surgical consultation has been arranged with Dr. ZEINAB at
[REDACTED] on [DATE].

The patient is scheduled for a Left breast stereotactic guided
biopsy for a third group of calcifications in the lower outer
quadrant on [DATE]. Further recommendations will be guided
by the results of this biopsy.

One might consider a bilateral breast MRI for further evaluation of
extent of disease given the high grade histology.

Pathology results reported by ZEINAB, RN on [DATE].



# 1) Lesion quadrant: LOWER OUTER QUADRANT.

Using sterile technique with chlorhexidine as skin antisepsis, 1%
lidocaine and 1% lidocaine with epinephrine as local anesthetic,
under stereotactic guidance, a 9 gauge Brevera vacuum assisted
device was used to perform core needle biopsy of the calcifications
in the LOWER OUTER QUADRANT at POSTERIOR depth using a lateral
approach. Specimen radiograph was performed showing calcifications
in at least 2 of the core samples. Specimens with calcifications are
identified for pathology. At the conclusion of the procedure, a coil
shaped tissue marker clip was deployed into the biopsy cavity.

# 2) Lesion quadrant: UPPER OUTER QUADRANT.

Using sterile technique with chlorhexidine as skin antisepsis, 1%
lidocaine and 1% lidocaine with epinephrine as local anesthetic,
under stereotactic guidance, a 9 gauge Brevera vacuum assisted
device was used to perform core needle biopsy of the calcifications
in the UPPER OUTER QUADRANT MIDDLE depth using a lateral approach.
Specimen radiograph was performed showing calcifications in at least
1 of the core samples. Specimens with calcifications are identified
for pathology. At the conclusion of the procedure, an X shaped
tissue marker clip was deployed into the biopsy cavity.

Follow-up 2-view mammogram was performed and dictated separately.
IMPRESSION: Stereotactic-guided biopsy of 2 groups of indeterminate
calcifications involving the LEFT breast. No apparent complications.

## 2020-09-20 ENCOUNTER — Other Ambulatory Visit: Payer: Self-pay | Admitting: Physician Assistant

## 2020-09-20 DIAGNOSIS — R921 Mammographic calcification found on diagnostic imaging of breast: Secondary | ICD-10-CM

## 2020-09-22 ENCOUNTER — Ambulatory Visit
Admission: RE | Admit: 2020-09-22 | Discharge: 2020-09-22 | Disposition: A | Discharge status: Home / Self Care | Payer: Federal, State, Local not specified - PPO | Source: Ambulatory Visit | Attending: Physician Assistant | Admitting: Physician Assistant

## 2020-09-22 ENCOUNTER — Other Ambulatory Visit: Payer: Self-pay

## 2020-09-22 DIAGNOSIS — C50919 Malignant neoplasm of unspecified site of unspecified female breast: Secondary | ICD-10-CM

## 2020-09-22 DIAGNOSIS — R921 Mammographic calcification found on diagnostic imaging of breast: Secondary | ICD-10-CM

## 2020-09-22 HISTORY — DX: Malignant neoplasm of unspecified site of unspecified female breast: C50.919

## 2020-09-22 IMAGING — MG MM BREAST BX W LOC DEV 1ST LESION IMAGE BX SPEC STEREO GUIDE*L*
8 of 11 series · 8 of 23 positions shown · non-contrast
Comparison: Previous exams.
COMPARISON: Previous exams.

Addendum:
CLINICAL DATA: Patient presents for stereotactic core needle biopsy
of a third group of left breast calcifications. Patient underwent
stereotactic biopsy of 2 other groups of calcifications on
[DATE], 1 group yielding high-grade DCIS, localized with an X
shaped biopsy clip, the other group fibrocystic changes without
evidence of atypia or malignancy, coil shaped biopsy clip.

EXAM:
LEFT BREAST STEREOTACTIC CORE NEEDLE BIOPSY

[L (1 of 7)]
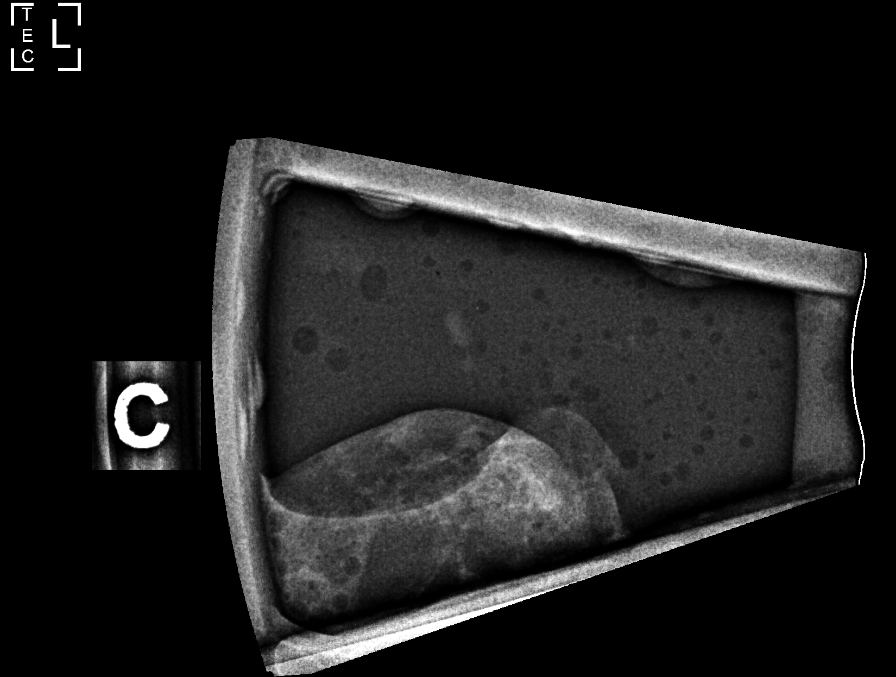

[L (2 of 7)]
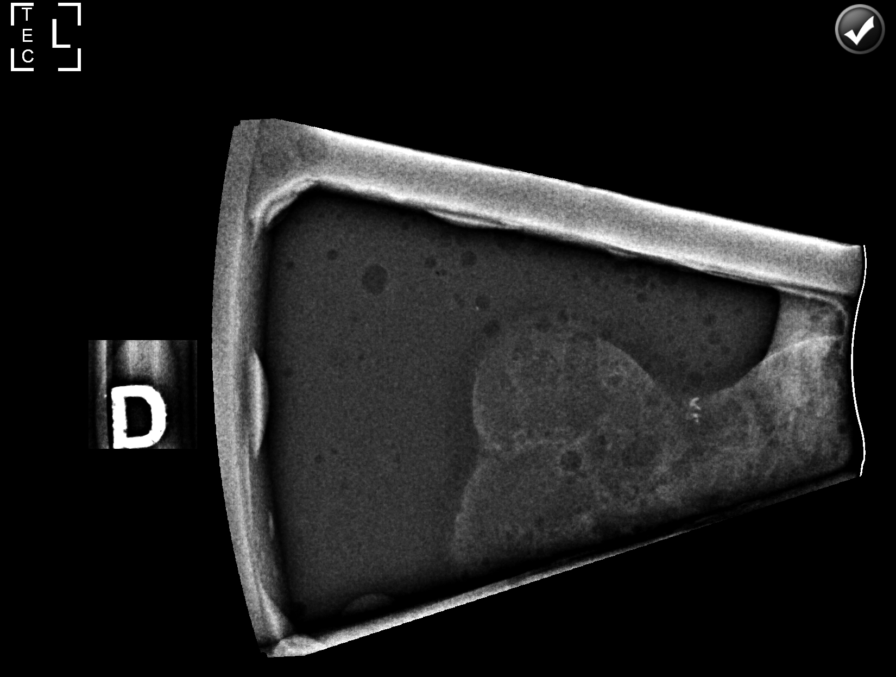

[L (3 of 7)]
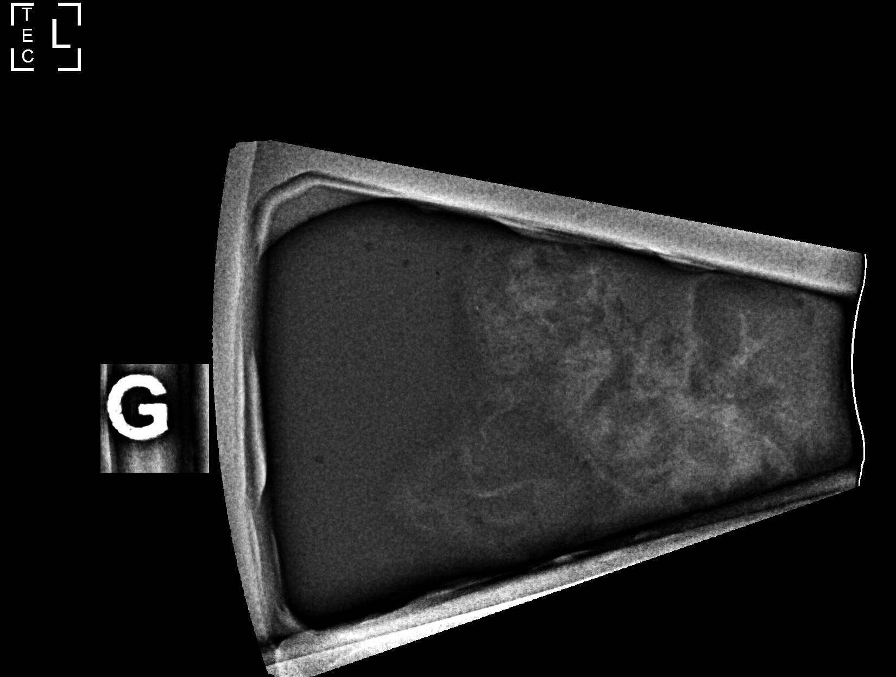

[L (4 of 7)]
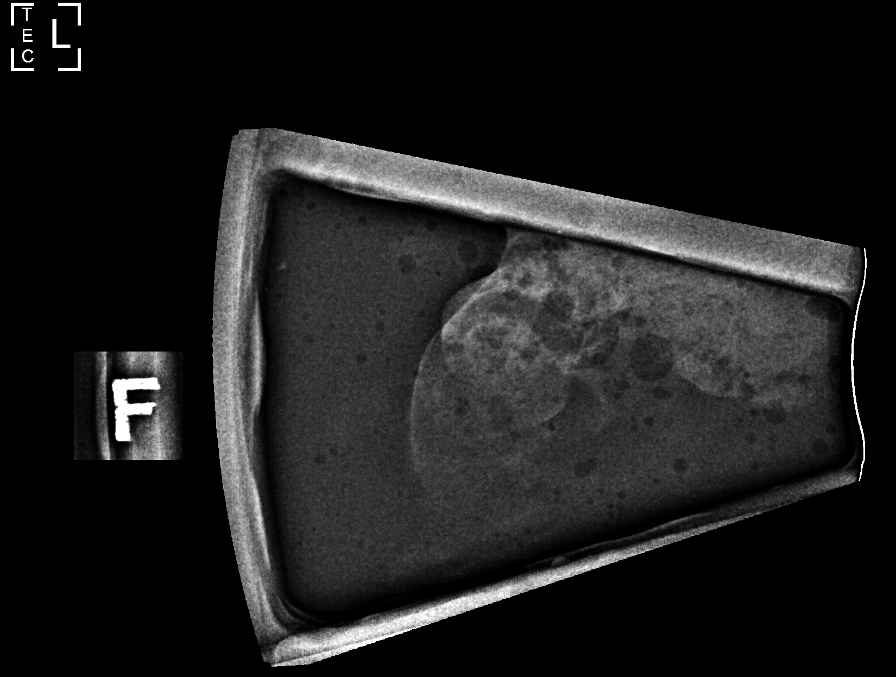

[L (5 of 7)]
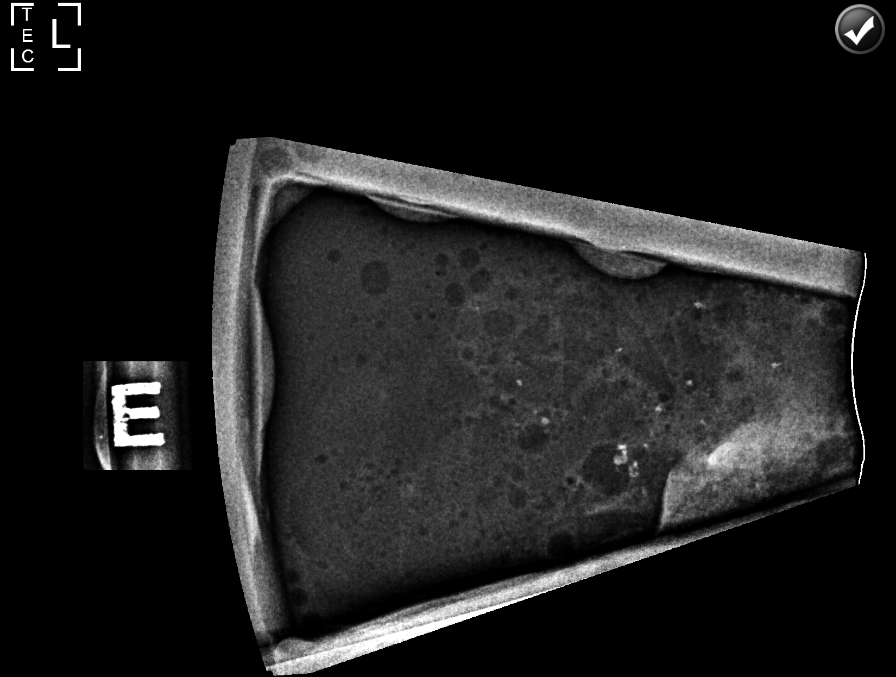

[L (6 of 7)]
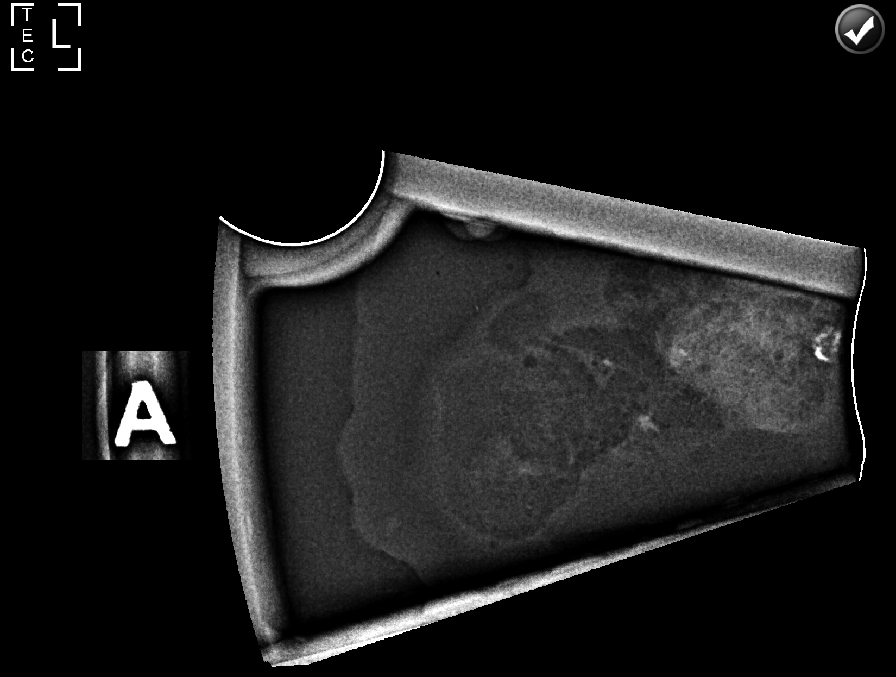

[L (7 of 7)]
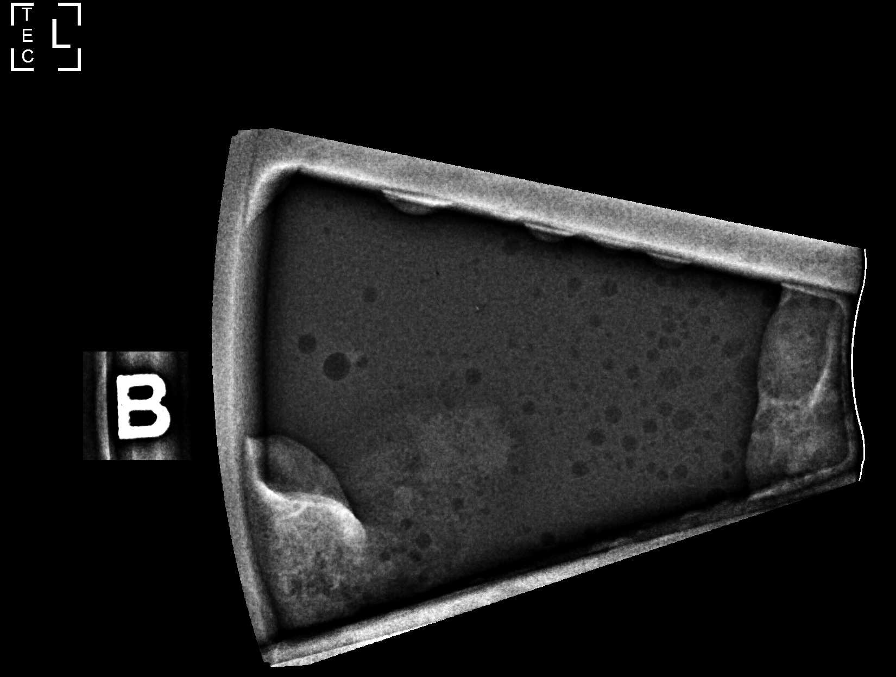

[L LM]
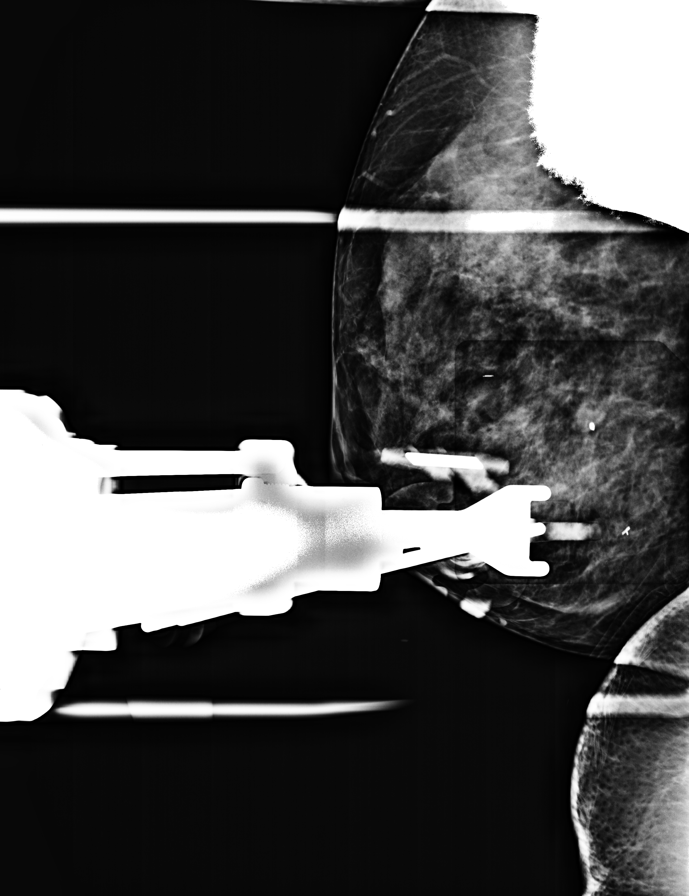

[8 of 23 positions shown; findings below may reference images not displayed]



Using sterile technique and 1% Lidocaine as local anesthetic, under
stereotactic guidance, a 9 gauge vacuum assisted device was used to
perform core needle biopsy of calcifications in the lower outer
quadrant of the left breast using a lateral approach. Specimen
radiograph was performed showing multiple calcifications for which
biopsy was performed. Specimens with calcifications are identified
for pathology.

Lesion quadrant: Lower outer quadrant

At the conclusion of the procedure, ribbon shaped tissue marker clip
was deployed into the biopsy cavity. Follow-up 2-view mammogram was
performed and dictated separately.
IMPRESSION: Stereotactic-guided biopsy of left breast calcifications. No
apparent complications.

ADDENDUM:
Pathology revealed DUCTAL CARCINOMA IN SITU WITH CALCIFICATIONS AND
NECROSIS of the Left breast, lower outer quadrant. This was found to
be concordant by Dr. HENRIKISSINGUER.

Pathology results were discussed with the patient by telephone. The
patient reported doing well after the biopsy with tenderness at the
site. Post biopsy instructions and care were reviewed and questions
were answered. The patient was encouraged to call The [REDACTED] for any additional concerns. My direct phone
number was provided.

The patient has a recent diagnosis of Left breast cancer and is
scheduled to see Dr. HENRIKISSINGUER at [REDACTED] on [REDACTED]

Pathology results reported by HENRIKISSINGUER, RN on [DATE].



Using sterile technique and 1% Lidocaine as local anesthetic, under
stereotactic guidance, a 9 gauge vacuum assisted device was used to
perform core needle biopsy of calcifications in the lower outer
quadrant of the left breast using a lateral approach. Specimen
radiograph was performed showing multiple calcifications for which
biopsy was performed. Specimens with calcifications are identified
for pathology.

Lesion quadrant: Lower outer quadrant

At the conclusion of the procedure, ribbon shaped tissue marker clip
was deployed into the biopsy cavity. Follow-up 2-view mammogram was
performed and dictated separately.
IMPRESSION: Stereotactic-guided biopsy of left breast calcifications. No
apparent complications.

## 2020-09-22 IMAGING — MG MM BREAST LOCALIZATION CLIP
4 series · 4 of 12 positions shown · non-contrast
Comparison: Previous exam(s).

CLINICAL DATA: Evaluate post biopsy marker clip placement following
stereotactic core needle biopsy of left breast calcifications.

EXAM:
DIAGNOSTIC LEFT MAMMOGRAM POST STEREOTACTIC BIOPSY

[L LM synth-2D]
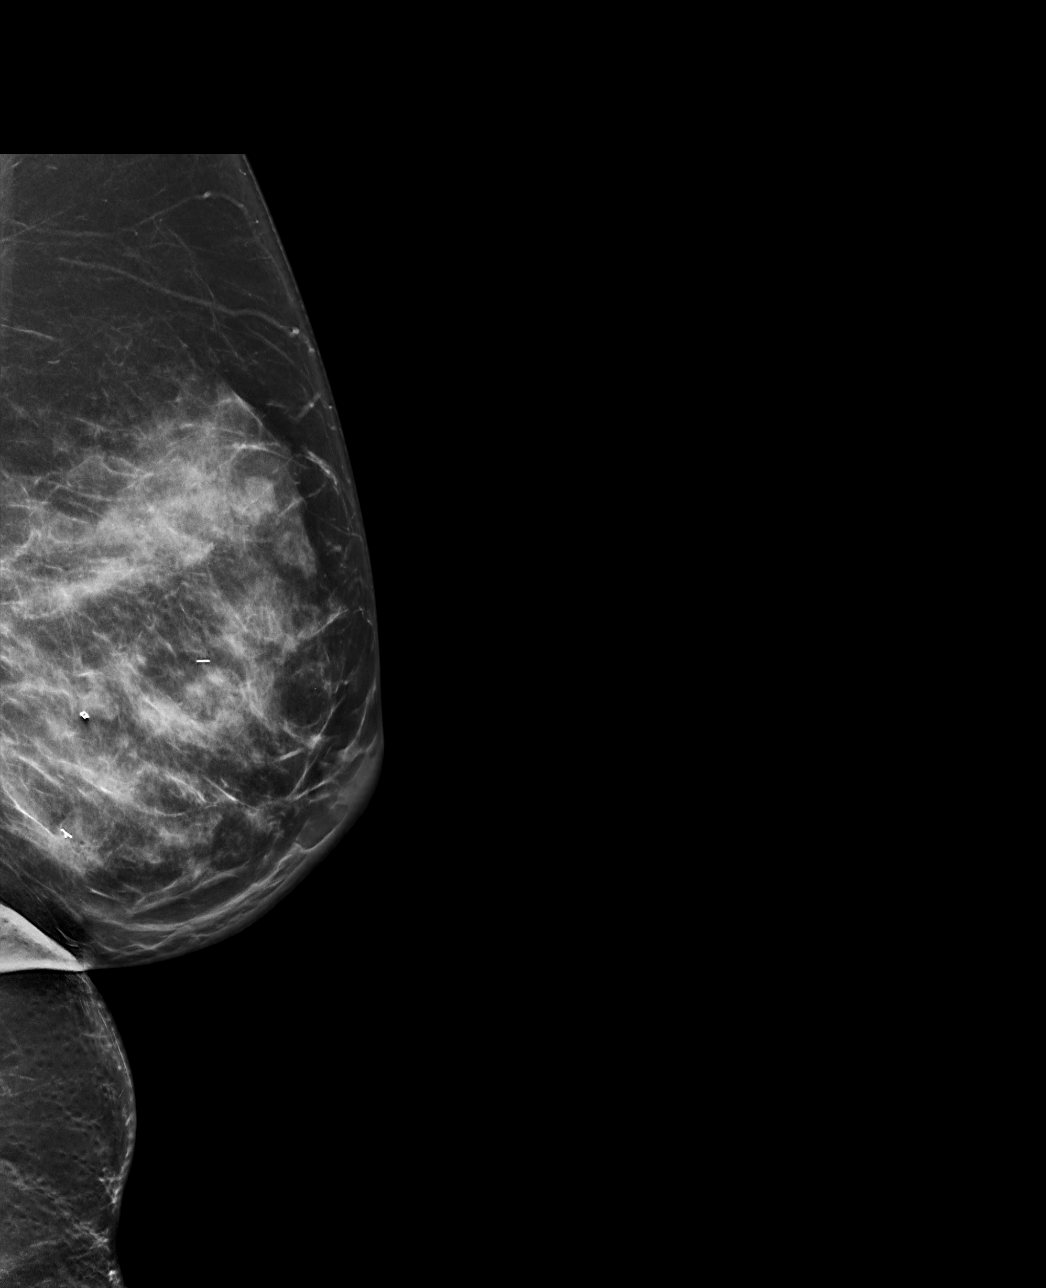

[L CC synth-2D]
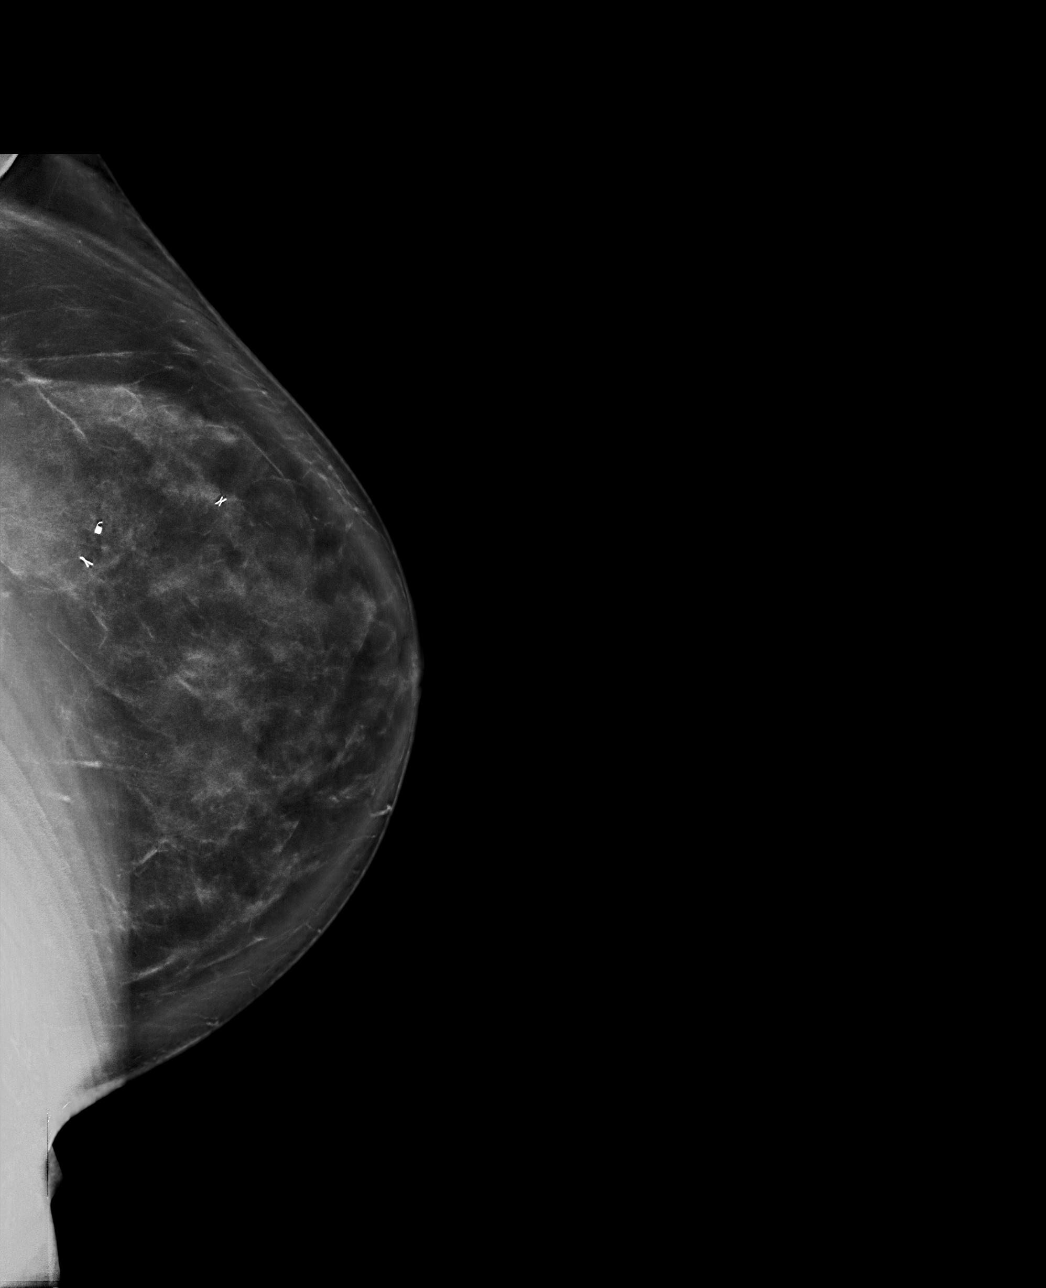

[L CC tomo · tomo slice 53/106.0]
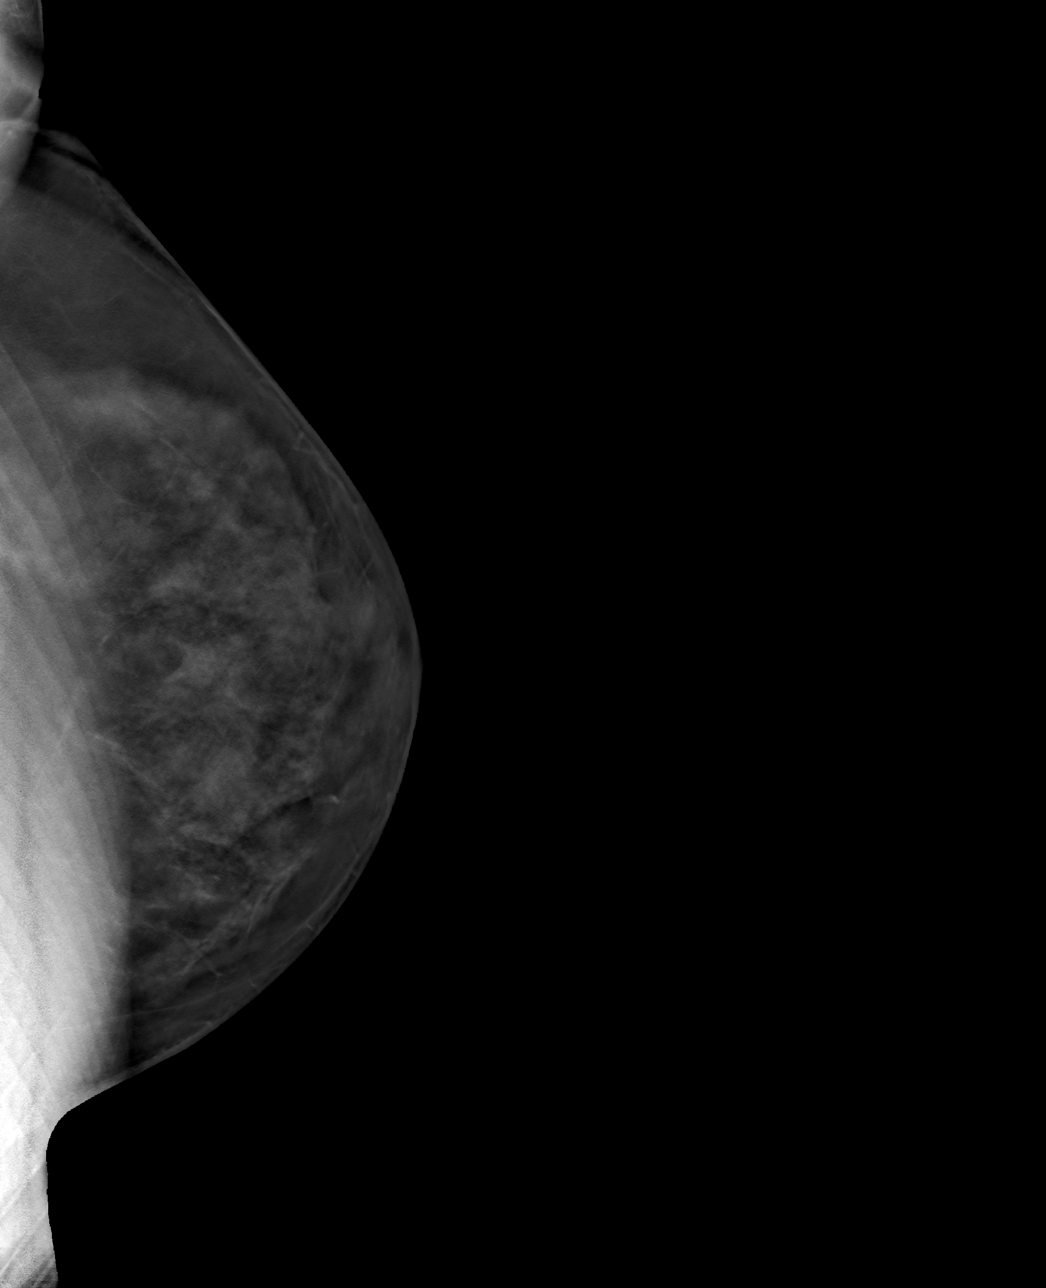

[L LM tomo · tomo slice 45/88.0]
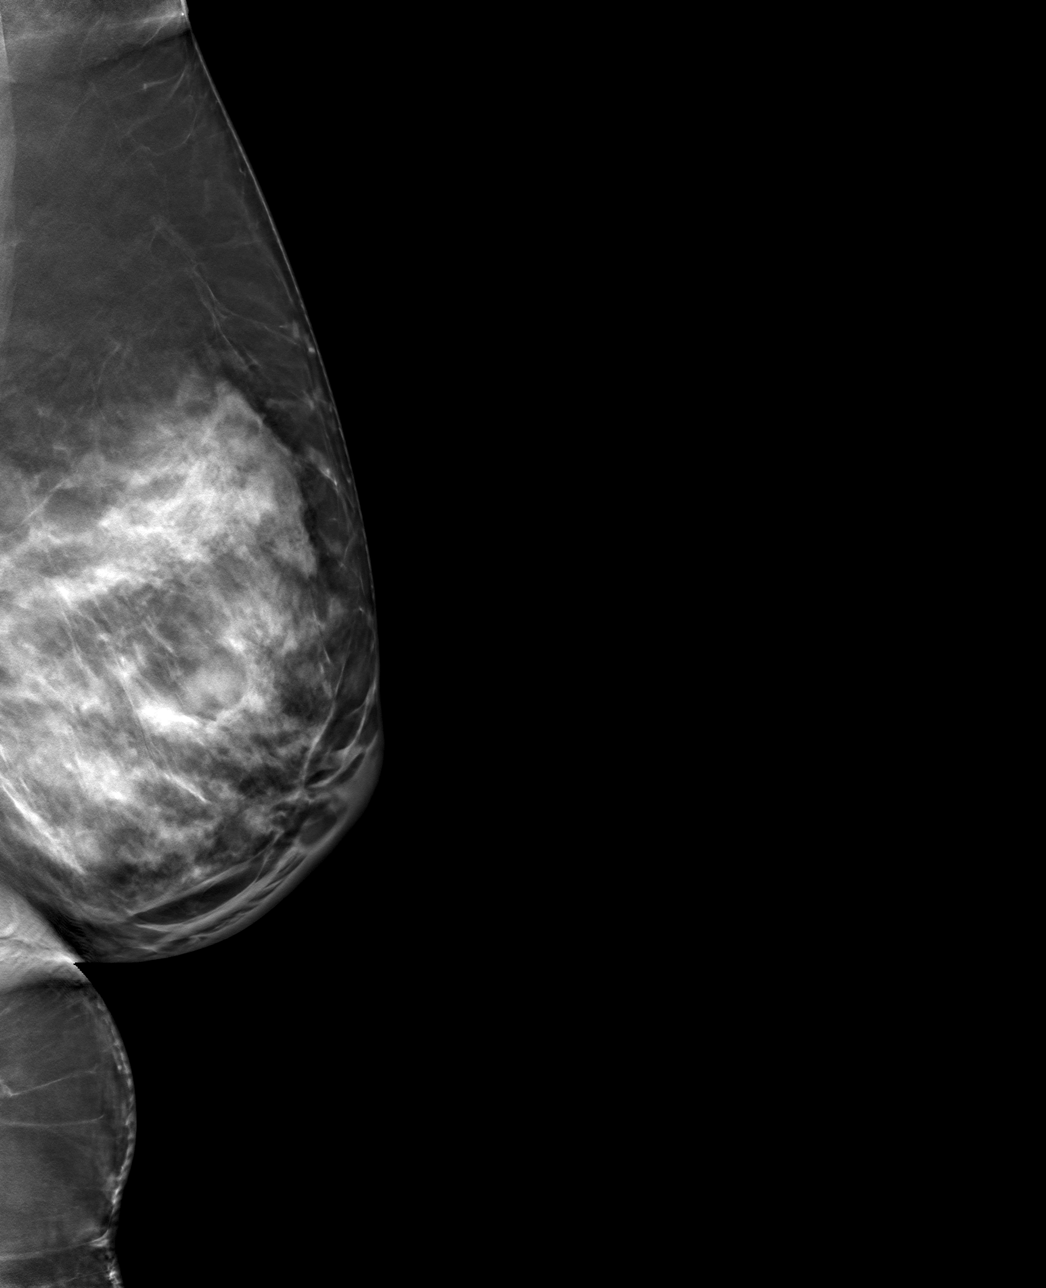

[4 of 12 positions shown; findings below may reference images not displayed]

FINDINGS: Mammographic images were obtained following stereotactic guided
biopsy of left breast calcifications. The biopsy marking clip is in
expected position at the site of biopsy.
IMPRESSION: Appropriate positioning of the ribbon shaped biopsy marking clip at
the site of biopsy in the in the lower outer quadrant of the left
breast, in the expected location of the calcifications. No residual
calcifications are visualized.

Final Assessment: Post Procedure Mammograms for Marker Placement

## 2020-09-27 ENCOUNTER — Other Ambulatory Visit (HOSPITAL_COMMUNITY): Payer: Self-pay | Admitting: General Surgery

## 2020-09-27 ENCOUNTER — Other Ambulatory Visit: Payer: Self-pay | Admitting: General Surgery

## 2020-09-27 DIAGNOSIS — D0512 Intraductal carcinoma in situ of left breast: Secondary | ICD-10-CM

## 2020-09-27 NOTE — Progress Notes (Signed)
New Breast Cancer Diagnosis: Left Breast Cancer- LOQ  Did patient present with symptoms (if so, please note symptoms) or screening mammography?:Screening Mass noted two 5 mm areas of abnormal calcifications in the outer and lower outer left breast.   MRI Breast 10/01/2020  Location and Extent of disease :left breast. Located at outer and lower outer quadrant, measured 0.6 cm greatest linear dimension.   Histology per Pathology Report: grade , DCIS  09/22/2020   09/19/2020  Receptor Status: ER(positive), PR (positive), Her2-neu (), Ki-(%)  Surgeon and surgical plan, if any: Dr. Marlou Starks 09/27/2020 -She appears to have 2 areas of DCIS in the outer and lower outer left breast.  Because of the two areas looked similar and because of the density of there breast I think it would be reasonable to evaluate her further with MRI to get a true estimate of the size of the area involved. -If these two areas are small and discrete then she seems to be favoring breast conservation. -If the areas seem part of a much larger process then at that point mastectomy may be the better option. -I will refer her to medical and radiation oncology to discuss adjuvant therapy.  Medical oncologist, treatment if any:   No appointments at this time  Family History of Breast/Ovarian/Prostate Cancer: Maternal and Paternal aunt had breast cancer  Lymphedema issues, if any: No  Pain issues, if any: No  SAFETY ISSUES: Prior radiation? no Pacemaker/ICD? No Possible current pregnancy? Monthly cycles, Born with no uterus Is the patient on methotrexate? No   Current Complaints / other details:

## 2020-09-28 ENCOUNTER — Telehealth: Payer: Self-pay | Admitting: Radiation Oncology

## 2020-09-28 ENCOUNTER — Ambulatory Visit
Admission: RE | Admit: 2020-09-28 | Discharge: 2020-09-28 | Disposition: A | Discharge status: Home / Self Care | Payer: Federal, State, Local not specified - PPO | Source: Ambulatory Visit | Attending: Radiation Oncology | Admitting: Radiation Oncology

## 2020-09-28 ENCOUNTER — Encounter: Payer: Self-pay | Admitting: Adult Health

## 2020-09-28 ENCOUNTER — Encounter: Payer: Self-pay | Admitting: Radiation Oncology

## 2020-09-28 ENCOUNTER — Other Ambulatory Visit: Payer: Self-pay

## 2020-09-28 VITALS — Ht 66.0 in | Wt 185.0 lb

## 2020-09-28 DIAGNOSIS — Z170 Estrogen receptor positive status [ER+]: Secondary | ICD-10-CM | POA: Insufficient documentation

## 2020-09-28 DIAGNOSIS — C50512 Malignant neoplasm of lower-outer quadrant of left female breast: Secondary | ICD-10-CM

## 2020-09-28 DIAGNOSIS — C50412 Malignant neoplasm of upper-outer quadrant of left female breast: Secondary | ICD-10-CM | POA: Insufficient documentation

## 2020-09-28 HISTORY — DX: Agenesis and aplasia of uterus: Q51.0

## 2020-09-28 HISTORY — DX: Unspecified asthma, uncomplicated: J45.909

## 2020-09-28 NOTE — Telephone Encounter (Signed)
Scheduled appt per 3/30 sch msg. Called pt, no answer. Left vm with appt date and time.

## 2020-09-28 NOTE — Progress Notes (Signed)
Radiation Oncology         [phone] ________________________________  Initial Outpatient Consultation - Conducted via telephone due to current COVID-19 concerns for limiting patient exposure  I spoke with the patient to conduct this consult visit via telephone to spare the patient unnecessary potential exposure in the healthcare setting during the current COVID-19 pandemic. The patient was notified in advance and was offered a [facility] meeting to allow for face to face communication but unfortunately reported that they did not have the appropriate resources/technology to support such a visit and instead preferred to proceed with a telephone consult.   Name: Autumn Roman        MRN: [id number]  Date of Service: 09/28/2020 DOB: December 23, 1970  CC:Heywood Bene, PA-C  Jovita Kussmaul, MD     REFERRING PHYSICIAN: Autumn Messing III, MD   DIAGNOSIS: The encounter diagnosis was Malignant neoplasm of lower-outer quadrant of left breast of female, estrogen receptor positive ([place]).   HISTORY OF PRESENT ILLNESS: Autumn Roman is a 50 y.o. female seen at the request of Dr. Marlou Starks for a newly diagnosed left breast cancer. She was found to have screening detected calcifications in the lower outer quadant and also in the lower inner quadrant. Further diagnostic imaging showed a third group of calcifications. She underwent three biopsies on two separate occasions. 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 Her cancer was ER/PR positive. The calcifications in the lower outer quadrant showed benign findings. The third biopsy of another group of calcifications revealed similar high grade DCIS with calcifications that was ER/PR positive. She's met with Dr. Marlou Starks and was offered MRI for extent of disease with likely lumpectomy x2, but is also considering her options of mastectomy and genetics. She's seen today to discuss options of radiotherapy if she chooses  breast conserving surgery.    PREVIOUS RADIATION THERAPY: No   PAST MEDICAL HISTORY:  Past Medical History:  Diagnosis Date  . 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 Asthma   . Breast cancer ([place]) 09/22/2020       PAST SURGICAL HISTORY:History reviewed. No pertinent surgical history.   FAMILY HISTORY:  Family History  Problem Relation Age of Onset  . Breast cancer Mother   . Breast cancer Maternal Aunt      SOCIAL HISTORY:  reports that she has never smoked. She has never used smokeless tobacco. She reports previous alcohol use. She reports that she does not use drugs. The patient is married and lives in [place]. 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 She enjoys home renovations projects.   ALLERGIES: Patient has no allergy information on record.   MEDICATIONS:  Current Outpatient Medications  Medication Sig Dispense Refill  . ADVAIR HFA 115-21 MCG/ACT inhaler SMARTSIG:2 Puff(s) By Mouth Twice Daily    . albuterol (VENTOLIN HFA) 108 (90 Base) MCG/ACT inhaler Inhale 2 puffs into the lungs every 6 (six) hours as needed.    Marland Kitchen ascorbic acid (VITAMIN C) 500 MG tablet Take by mouth.    . Biotin 1000 MCG tablet Take by mouth.    . Collagen-Vitamin C-Biotin (COLLAGEN 1500/C PO) Take by mouth.    . ergocalciferol (VITAMIN D2) 1.25 MG (50000 UT) capsule Take by mouth.    . montelukast (SINGULAIR) 10 MG tablet Take 1 tablet by mouth daily.    . Multiple Vitamins-Minerals (ONCOVITE) TABS Take by mouth.  No current facility-administered medications for this encounter.     REVIEW OF SYSTEMS: On review of systems, the patient reports that she is doing well overall. She denies any concerns about her breast at this time.      PHYSICAL EXAM:  Wt Readings from Last 3 Encounters:  09/28/20 185 lb (83.9 kg)   Unable to assess due to encounter type.   ECOG = 0  0 - Asymptomatic (Fully active, able to carry on all predisease activities  without restriction)  1 - Symptomatic but completely ambulatory (Restricted in physically strenuous activity but ambulatory and able to carry out work of a light or sedentary nature. For example, light housework, office work)  2 - Symptomatic, <50% in bed during the day (Ambulatory and capable of all self care but unable to carry out any work activities. Up and about more than 50% of waking hours)  3 - Symptomatic, >50% in bed, but not bedbound (Capable of only limited self-care, confined to bed or chair 50% or more of waking hours)  4 - Bedbound (Completely disabled. Cannot carry on any self-care. Totally confined to bed or chair)  5 - Death   Eustace Pen MM, Creech RH, Tormey DC, et al. [phone]). "Toxicity and response criteria of the [facility]". [place] Oncol. 5 (6): 649-55    LABORATORY DATA:  No results found for: WBC, HGB, HCT, MCV, PLT No results found for: NA, K, CL, CO2 No results found for: ALT, AST, GGT, ALKPHOS, BILITOT    RADIOGRAPHY: MM Digital Diagnostic Unilat L  Result Date: 09/15/2020 CLINICAL DATA:  Patient recalled from screening for left breast calcifications. EXAM: DIGITAL DIAGNOSTIC UNILATERAL LEFT MAMMOGRAM WITH CAD TECHNIQUE: Left digital diagnostic mammography was performed. Mammographic images were processed with CAD. COMPARISON:  Previous exam(s). ACR Breast Density Category c: The breast tissue is heterogeneously dense, which may obscure small masses. FINDINGS: Magnification cc and true lateral views of the left breast were obtained. Within the outer left breast middle depth there is a 5 mm group of coarse heterogeneous calcifications. Within the lower outer left breast middle depth there is an additional 5 mm group of coarse heterogeneous calcifications. IMPRESSION: There are 2 indeterminate groups of calcifications within the left breast demonstrated best on the true lateral view. RECOMMENDATION: Stereotactic guided core needle biopsy of  both groups of indeterminate left breast calcifications. I have discussed the findings and recommendations with the patient. If applicable, a reminder letter will be sent to the patient regarding the next appointment. BI-RADS CATEGORY  4: Suspicious. Electronically Signed   By: Lovey Newcomer M.D.   On: 09/15/2020 10:11   MM CLIP PLACEMENT LEFT  Addendum Date: 09/23/2020   ADDENDUM REPORT: 09/23/2020 08:26 ADDENDUM: After reviewing the images, I believe that the coil shaped tissue marker clip did not migrate and a third group calcifications in the LOWER OUTER QUADRANT were sampled. On the CC view, this third group of calcifications overlies the larger group of calcifications that are more inferiorly located. The patient is scheduled to return for biopsy of the larger group of calcifications in the [place] that are more inferiorly located. Electronically Signed   By: Evangeline Dakin M.D.   On: 09/23/2020 08:26   Result Date: 09/23/2020 CLINICAL DATA:  Confirmation of clip placement after stereotactic tomosynthesis core needle biopsy of 2 groups of indeterminate calcifications involving the LEFT breast. EXAM: 2D AND TOMOSYNTHESIS DIAGNOSTIC LEFT MAMMOGRAM POST STEREOTACTIC BIOPSY COMPARISON:  Previous exam(s). FINDINGS: Tomosynthesis and  synthesized full field CC and mediolateral images were obtained following stereotactic tomosynthesis guided biopsy of 2 groups of calcifications involving the LEFT breast. The coil shaped biopsy marking clip migrated approximately 2 cm SUPERIOR to the calcifications in the LOWER OUTER QUADRANT at POSTERIOR depth, the calcifications were present in the core samples. There are residual calcifications in the LOWER OUTER QUADRANT at POSTERIOR depth. The X shaped tissue marking clip is appropriately position at the site of the biopsied calcifications in the UPPER OUTER quadrant at MIDDLE depth. The clips are separated by approximately 2.4 cm. The residual calcifications in  the LOWER OUTER QUADRANT at POSTERIOR depth are approximately 4 cm from the X shaped clip. Expected post biopsy changes are present at both sites without evidence of hematoma. IMPRESSION: 1. Approximate 2 cm SUPERIOR migration of the coil shaped tissue marker clip relative to the calcifications in the LOWER OUTER QUADRANT at POSTERIOR depth. There are residual calcifications at the site. 2. Appropriate positioning of the X shaped tissue marker clip at the site of the biopsied calcifications in the UPPER OUTER QUADRANT MIDDLE depth. 3. The clips are separated by approximately 2.4 cm. The residual calcifications in the LOWER OUTER QUADRANT are approximately 4 cm from the X shaped clip. Final Assessment: Post Procedure Mammograms for Marker Placement Electronically Signed: By: Evangeline Dakin M.D. On: 09/19/2020 08:56   MM CLIP PLACEMENT LEFT  Result Date: 09/22/2020 CLINICAL DATA:  Evaluate post biopsy marker clip placement following stereotactic core needle biopsy of left breast calcifications. EXAM: DIAGNOSTIC LEFT MAMMOGRAM POST STEREOTACTIC BIOPSY COMPARISON:  Previous exam(s). FINDINGS: Mammographic images were obtained following stereotactic guided biopsy of left breast calcifications. The biopsy marking clip is in expected position at the site of biopsy. IMPRESSION: Appropriate positioning of the ribbon shaped biopsy marking clip at the site of biopsy in the in the lower outer quadrant of the left breast, in the expected location of the calcifications. No residual calcifications are visualized. Final Assessment: Post Procedure Mammograms for Marker Placement Electronically Signed   By: Lajean Manes M.D.   On: 09/22/2020 08:36   MM LT BREAST BX W LOC DEV 1ST LESION IMAGE BX SPEC STEREO GUIDE  Addendum Date: 09/26/2020   ADDENDUM REPORT: 09/26/2020 09:54 ADDENDUM: Pathology revealed DUCTAL CARCINOMA IN SITU WITH CALCIFICATIONS AND NECROSIS of the Left breast, lower outer quadrant. This was found to be  concordant by Dr. Lajean Manes. Pathology results were discussed with the patient by telephone. The patient reported doing well after the biopsy with tenderness at the site. Post biopsy instructions and care were reviewed and questions were answered. The patient was encouraged to call [facility] for any additional concerns. My direct phone number was provided. The patient has a recent diagnosis of Left breast cancer and is scheduled to see Dr. Autumn Messing at [facility] on September 27, 2020. Pathology results reported by Terie Purser, RN on 09/26/2020. Electronically Signed   By: Lajean Manes M.D.   On: 09/26/2020 09:54   Result Date: 09/26/2020 CLINICAL DATA:  Patient presents for stereotactic core needle biopsy of a third group of left breast calcifications. Patient underwent stereotactic biopsy of 2 other groups of calcifications on 09/19/2020, 1 group yielding high-grade DCIS, localized with an X shaped biopsy clip, the other group fibrocystic changes without evidence of atypia or malignancy, coil shaped biopsy clip. EXAM: LEFT BREAST STEREOTACTIC CORE NEEDLE BIOPSY COMPARISON:  Previous exams. FINDINGS: The patient and I discussed the procedure of stereotactic-guided biopsy  including benefits and alternatives. We discussed the high likelihood of a successful procedure. We discussed the risks of the procedure including infection, bleeding, tissue injury, clip migration, and inadequate sampling. Informed written consent was given. The usual time out protocol was performed immediately prior to the procedure. Using sterile technique and 1% Lidocaine as local anesthetic, under stereotactic guidance, a 9 gauge vacuum assisted device was used to perform core needle biopsy of calcifications in the lower outer quadrant of the left breast using a lateral approach. Specimen radiograph was performed showing multiple calcifications for which biopsy was performed. Specimens with  calcifications are identified for pathology. Lesion quadrant: Lower outer quadrant At the conclusion of the procedure, ribbon shaped tissue marker clip was deployed into the biopsy cavity. Follow-up 2-view mammogram was performed and dictated separately. IMPRESSION: Stereotactic-guided biopsy of left breast calcifications. No apparent complications. Electronically Signed: By: Lajean Manes M.D. On: 09/22/2020 08:26   MM LT BREAST BX W LOC DEV 1ST LESION IMAGE BX SPEC STEREO GUIDE  Addendum Date: 09/20/2020   ADDENDUM REPORT: 09/20/2020 14:11 ADDENDUM: Pathology revealed MILD FIBROCYSTIC CHANGE WITH FOCAL DYSTROPHIC CALCIFICATIONS of the Left breast, lower outer quadrant, posterior depth. This was found to be concordant by Dr. Peggye Fothergill. 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 This was found to be concordant by Dr. Peggye Fothergill. Pathology results were discussed with the patient by telephone. The patient reported doing well after the biopsies with tenderness at the sites. Post biopsy instructions and care were reviewed and questions were answered. The patient was encouraged to call [facility] for any additional concerns. My direct phone number was provided. Surgical consultation has been arranged with Dr. Autumn Messing at [facility] on September 27, 2020. The patient is scheduled for a Left breast stereotactic guided biopsy for a third group of calcifications in the lower outer quadrant on September 22, 2020. Further recommendations will be guided by the results of this biopsy. One might consider a bilateral breast MRI for further evaluation of extent of disease given the high grade histology. Pathology results reported by Terie Purser, RN on 09/20/2020. Electronically Signed   By: Evangeline Dakin M.D.   On: 09/20/2020 14:11   Result Date: 09/20/2020 CLINICAL DATA:   50 year old with 2 groups of screening detected indeterminate calcifications involving the LEFT breast, each group measuring approximately 5 mm. One of the groups is located in the [place] at POSTERIOR depth and the other group is located in the [place] at MIDDLE depth. EXAM: LEFT BREAST STEREOTACTIC CORE NEEDLE BIOPSY x 2 COMPARISON:  Previous exams. FINDINGS: The patient and I discussed the procedure of stereotactic-guided biopsy including benefits and alternatives. We discussed the high likelihood of a successful procedure. We discussed the risks of the procedure including infection, bleeding, tissue injury, clip migration, and inadequate sampling. Informed written consent was given. The usual time out protocol was performed immediately prior to the procedure. # 1) Lesion quadrant: LOWER OUTER QUADRANT. Using sterile technique with chlorhexidine as skin antisepsis, 1% lidocaine and 1% lidocaine with epinephrine as local anesthetic, under stereotactic guidance, a 9 gauge Brevera vacuum assisted device was used to perform core needle biopsy of the calcifications in the LOWER OUTER QUADRANT at POSTERIOR depth using a lateral approach. Specimen radiograph was performed showing calcifications in at least 2 of the core samples. Specimens with calcifications are identified for pathology. At the  conclusion of the procedure, a coil shaped tissue marker clip was deployed into the biopsy cavity. # 2) Lesion quadrant: UPPER OUTER QUADRANT. Using sterile technique with chlorhexidine as skin antisepsis, 1% lidocaine and 1% lidocaine with epinephrine as local anesthetic, under stereotactic guidance, a 9 gauge Brevera vacuum assisted device was used to perform core needle biopsy of the calcifications in the UPPER OUTER QUADRANT MIDDLE depth using a lateral approach. Specimen radiograph was performed showing calcifications in at least 1 of the core samples. Specimens with calcifications are identified  for pathology. At the conclusion of the procedure, an X shaped tissue marker clip was deployed into the biopsy cavity. Follow-up 2-view mammogram was performed and dictated separately. IMPRESSION: Stereotactic-guided biopsy of 2 groups of indeterminate calcifications involving the LEFT breast. No apparent complications. Electronically Signed: By: Evangeline Dakin M.D. On: 09/19/2020 08:57   MM LT BREAST BX W LOC DEV EA AD LESION IMG BX SPEC STEREO GUIDE  Addendum Date: 09/20/2020   ADDENDUM REPORT: 09/20/2020 14:11 ADDENDUM: Pathology revealed MILD FIBROCYSTIC CHANGE WITH FOCAL DYSTROPHIC CALCIFICATIONS of the Left breast, lower outer quadrant, posterior depth. This was found to be concordant by Dr. Peggye Fothergill. 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 This was found to be concordant by Dr. Peggye Fothergill. Pathology results were discussed with the patient by telephone. The patient reported doing well after the biopsies with tenderness at the sites. Post biopsy instructions and care were reviewed and questions were answered. The patient was encouraged to call [facility] for any additional concerns. My direct phone number was provided. Surgical consultation has been arranged with Dr. Autumn Messing at [facility] on September 27, 2020. The patient is scheduled for a Left breast stereotactic guided biopsy for a third group of calcifications in the lower outer quadrant on September 22, 2020. Further recommendations will be guided by the results of this biopsy. One might consider a bilateral breast MRI for further evaluation of extent of disease given the high grade histology. Pathology results reported by Terie Purser, RN on 09/20/2020. Electronically Signed   By: Evangeline Dakin M.D.   On: 09/20/2020 14:11   Result Date: 09/20/2020 CLINICAL DATA:  50 year old with 2 groups of  screening detected indeterminate calcifications involving the LEFT breast, each group measuring approximately 5 mm. One of the groups is located in the [place] at POSTERIOR depth and the other group is located in the [place] at MIDDLE depth. EXAM: LEFT BREAST STEREOTACTIC CORE NEEDLE BIOPSY x 2 COMPARISON:  Previous exams. FINDINGS: The patient and I discussed the procedure of stereotactic-guided biopsy including benefits and alternatives. We discussed the high likelihood of a successful procedure. We discussed the risks of the procedure including infection, bleeding, tissue injury, clip migration, and inadequate sampling. Informed written consent was given. The usual time out protocol was performed immediately prior to the procedure. # 1) Lesion quadrant: LOWER OUTER QUADRANT. Using sterile technique with chlorhexidine as skin antisepsis, 1% lidocaine and 1% lidocaine with epinephrine as local anesthetic, under stereotactic guidance, a 9 gauge Brevera vacuum assisted device was used to perform core needle biopsy of the calcifications in the LOWER OUTER QUADRANT at POSTERIOR depth using a lateral approach. Specimen radiograph was performed showing calcifications in at least 2 of the core samples. Specimens with calcifications are identified for pathology. At the conclusion of the procedure, a coil shaped tissue marker clip  was deployed into the biopsy cavity. # 2) Lesion quadrant: UPPER OUTER QUADRANT. Using sterile technique with chlorhexidine as skin antisepsis, 1% lidocaine and 1% lidocaine with epinephrine as local anesthetic, under stereotactic guidance, a 9 gauge Brevera vacuum assisted device was used to perform core needle biopsy of the calcifications in the UPPER OUTER QUADRANT MIDDLE depth using a lateral approach. Specimen radiograph was performed showing calcifications in at least 1 of the core samples. Specimens with calcifications are identified for pathology. At the  conclusion of the procedure, an X shaped tissue marker clip was deployed into the biopsy cavity. Follow-up 2-view mammogram was performed and dictated separately. IMPRESSION: Stereotactic-guided biopsy of 2 groups of indeterminate calcifications involving the LEFT breast. No apparent complications. Electronically Signed: By: Evangeline Dakin M.D. On: 09/19/2020 08:57       IMPRESSION/PLAN: 1. 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 Dr. Lisbeth Renshaw discusses the pathology findings and reviews the nature of earlybreast disease. The consensus from the breast conference includes proceeding with MRI of breasts to determine if she is a candidate for breast conservation with lumpectomy versus mastectomy. If the patient undergos breast conserving surgery, Dr. Lisbeth Renshaw would recommend external radiotherapy to the breast  to reduce risks of local recurrence followed by antiestrogen therapy. She is going to get set up to see medical oncology soon as well. We discussed the risks, benefits, short, and long term effects of radiotherapy, as well as the curative intent, and the patient is interested in proceeding if she undergoes lumpectomy. Dr. Lisbeth Renshaw discusses the delivery and logistics of radiotherapy and anticipates a course of 6 1/2 weeks of radiotherapy. We will see her back a few weeks after surgery to discuss the simulation process and anticipate we starting radiotherapy about 4-6 weeks after surgery.  2. Possible genetic predisposition to malignancy. The patient is a candidate for genetic testing given her personal and family history. She was offered referral and orders were placed.   Given current concerns for patient exposure during the COVID-19 pandemic, this encounter was conducted via telephone.  The patient has provided two factor identification and has given verbal consent for this type of encounter and has been advised to only accept a meeting of this type in a secure network environment. The time spent  during this encounter was 60 minutes including preparation, discussion, and coordination of the patient's care. The attendants for this meeting include Blenda Nicely, RN, Dr. Lisbeth Renshaw, Hayden Pedro  and Lyndle Herrlich.  During the encounter,  Blenda Nicely, RN, Dr. Lisbeth Renshaw, and Hayden Pedro were located at [facility] Radiation Oncology Department.  Autumn Roman was located at home.    The above documentation reflects my direct findings during this shared patient visit. Please see the separate note by Dr. Lisbeth Renshaw on this date for the remainder of the patient's plan of care.    Carola Rhine, [facility]    **Disclaimer: This note was dictated with voice recognition software. Similar sounding words can inadvertently be transcribed and this note may contain transcription errors which may not have been corrected upon publication of note.**

## 2020-09-29 ENCOUNTER — Inpatient Hospital Stay: Payer: Federal, State, Local not specified - PPO | Attending: Genetic Counselor | Admitting: Genetic Counselor

## 2020-09-29 ENCOUNTER — Other Ambulatory Visit: Payer: Self-pay | Admitting: Genetic Counselor

## 2020-09-29 ENCOUNTER — Encounter: Payer: Self-pay | Admitting: Genetic Counselor

## 2020-09-29 ENCOUNTER — Inpatient Hospital Stay: Payer: Federal, State, Local not specified - PPO

## 2020-09-29 DIAGNOSIS — C50412 Malignant neoplasm of upper-outer quadrant of left female breast: Secondary | ICD-10-CM

## 2020-09-29 DIAGNOSIS — Z170 Estrogen receptor positive status [ER+]: Secondary | ICD-10-CM

## 2020-09-29 DIAGNOSIS — Z800 Family history of malignant neoplasm of digestive organs: Secondary | ICD-10-CM

## 2020-09-29 DIAGNOSIS — Z808 Family history of malignant neoplasm of other organs or systems: Secondary | ICD-10-CM | POA: Insufficient documentation

## 2020-09-29 DIAGNOSIS — Z803 Family history of malignant neoplasm of breast: Secondary | ICD-10-CM | POA: Diagnosis not present

## 2020-09-29 LAB — GENETIC SCREENING ORDER

## 2020-09-29 NOTE — Progress Notes (Signed)
REFERRING PROVIDER: Ronny Bacon, PA-C [address] [place],  [place] [address]  PRIMARY PROVIDER:  Roger Kill, PA-C  PRIMARY REASON FOR VISIT:  1. Malignant neoplasm of upper-outer quadrant of left breast in female, estrogen receptor positive (HCC)   2. Family history of brain cancer   3. Family history of melanoma   4. Family history of breast cancer   5. Family history of esophageal cancer       HISTORY OF PRESENT ILLNESS:   Autumn Roman, a 50 y.o. female, was seen for a [facility] cancer genetics consultation at the request of Dr. Julien Girt due to a personal and family history of cancer.  Autumn Roman presents to clinic today to discuss the possibility of a hereditary predisposition to cancer, genetic testing, and to further clarify her future cancer risks, as well as potential cancer risks for family members.   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 The tumor is ER+ and PR+. The treatment plan includes surgery (date TBD).   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   CANCER HISTORY:  Oncology History  Malignant neoplasm of upper-outer quadrant of left breast in female, estrogen receptor positive (HCC)  09/22/2020 Cancer Staging   Staging form: Breast, AJCC 8th Edition - Clinical stage from 09/22/2020: Stage 0 (cTis (DCIS), cN0, cM0, ER+, PR+) - Signed by Loa Socks, NP on 09/28/2020   09/28/2020 Initial Diagnosis   Malignant neoplasm of upper-outer quadrant of left breast in female, estrogen receptor positive (HCC)      RISK FACTORS:  Menarche age: n/a - patient has MRKH syndrome.  No live births.  OCP use: no.  Ovaries intact: yes.  Hysterectomy: n/a.  Menopausal status: premenopausal.  HRT use: 0 years. Colonoscopy: yes; normal. Mammogram within the last year: yes. Any excessive  radiation exposure in the past: no.   Past Medical History:  Diagnosis Date  . 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 Asthma   . Breast cancer (HCC) 09/22/2020  . Family history of brain cancer   . Family history of breast cancer   . Family history of esophageal cancer   . Family history of melanoma     No past surgical history on file.  Social History   Socioeconomic History  . Marital status: Married    Spouse name: Not on file  . Number of children: Not on file  . Years of education: Not on file  . Highest education level: Not on file  Occupational History  . Not on file  Tobacco Use  . Smoking status: Never Smoker  . Smokeless tobacco: Never Used  Substance and Sexual Activity  . Alcohol use: Not Currently  . Drug use: Never  . Sexual activity: Not on file    Comment: Uterine Agenesis  Other Topics Concern  . Not on file  Social History Narrative  . Not on file   Social Determinants of Health   Financial Resource Strain: Not on file  Food Insecurity: Not on file  Transportation Needs: Not on file  Physical Activity: Not on file  Stress: Not on file  Social Connections: Not on file     FAMILY HISTORY:  We obtained a detailed, 4-generation family history.  Significant diagnoses are listed below: Family  History  Problem Relation Age of Onset  . Brain cancer Mother        dx 26s  . Breast cancer Maternal Aunt        bilateral mastectomies  . Melanoma Father        dx 4s  . Breast cancer Paternal Aunt   . Melanoma Paternal Uncle        dx 10s  . Esophageal cancer Paternal Grandmother        dx 64s   Autumn Roman has one adopted daughter (age 16) and one adopted son (age 52) - neither are biologically related to her. She has one brother and three sisters, all in their 75s and 53s.   Autumn Roman mother is alive at age 49 and has a history of brain cancer diagnosed in her 73s. There is one maternal aunt, who has a history of breast  cancer treated with bilateral mastectomies (unknown age of diagnosis). There is no known cancer among maternal cousins. Autumn Roman maternal grandmother died at age 6 without cancer. Her maternal grandfather died in his late 71s without cancer.   Autumn Roman father is alive at age 66 and has a history of spindle cell melanoma diagnosed on his head in his 25s. There were four paternal aunts and one paternal uncle. One aunt has a history of breast cancer (unknown age of diagnosis). One uncle was first diagnosed with melanoma in his 59s, and had a recurrence more recently (specific age unknown). There is no known cancer among paternal cousins. Autumn Roman paternal grandmother died in her 67s from esophageal cancer. Her paternal grandfather died in his 72s without cancer.  Autumn Roman is unaware of previous family history of genetic testing for hereditary cancer risks. Patient's maternal ancestors are of [place], [place], and [place] descent, and paternal ancestors are of [place] descent. There is no reported Ashkenazi Jewish ancestry. There is no known consanguinity.  GENETIC COUNSELING ASSESSMENT: Autumn Roman is a 50 y.o. female with a personal history of breast cancer and sarcoma, as well as a family history of brain cancer, melanoma, breast cancer, and esophageal cancer, which is somewhat suggestive of a hereditary cancer syndrome and predisposition to cancer. We, therefore, discussed and recommended the following at today's visit.   DISCUSSION: We discussed that approximately 5-10% of breast cancer is hereditary, with most cases associated with the BRCA1 and BRCA2 genes. There are other genes that can be associated with hereditary breast cancer syndromes. These include ATM, CHEK2, PALB2, etc. We discussed that testing is beneficial for several reasons, including knowing about other cancer risks, identifying potential screening and risk-reduction options that may be appropriate, and to understand if other  family members could be at risk for cancer and allow them to undergo genetic testing.  We reviewed the characteristics, features and inheritance patterns of hereditary cancer syndromes. We also discussed genetic testing, including the appropriate family members to test, the process of testing, insurance coverage and turn-around-time for results. We discussed the implications of a negative, positive and/or variant of uncertain significant result. In order to get genetic test results in a timely manner so that Autumn Roman can use these genetic test results for surgical decisions, we recommended Autumn Roman pursue genetic testing for the [facility] Breast Cancer STAT panel. Once complete, we recommend Autumn Roman pursue reflex genetic testing to the Multi-Cancer panel.   The Breast Cancer STAT Panel offered by [facility] includes sequencing and deletion/duplication analysis for the following 9 genes:  ATM, BRCA1, BRCA2,  CDH1, CHEK2, PALB2, PTEN, STK11 and TP53. The Multi-Cancer Panel offered by [facility] includes sequencing and/or deletion duplication testing of the following 84 genes: AIP, ALK, APC, ATM, AXIN2, BAP1, BARD1, BLM, BMPR1A, BRCA1, BRCA2, BRIP1, CASR, CDC73, CDH1, CDK4, CDKN1B, CDKN1C, CDKN2A (p14ARF), CDKN2A (p16INK4a), CEBPA, CHEK2, CTNNA1, DICER1, DIS3L2, EGFR (c.2369C>T, p.Thr790Met variant only), EPCAM (Deletion/duplication testing only), FH, FLCN, GATA2, GPC3, GREM1 (Promoter region deletion/duplication testing only), HOXB13 (c.251G>A, p.Gly84Glu), HRAS, KIT, MAX, MEN1, MET, MITF (c.952G>A, p.Glu318Lys variant only), MLH1, MSH2, MSH3, MSH6, MUTYH, NBN, NF1, NF2, NTHL1, PALB2, PDGFRA, PHOX2B, PMS2, POLD1, POLE, POT1, PRKAR1A, PTCH1, PTEN, RAD50, RAD51C, RAD51D, RB1, RECQL4, RET, RUNX1, SDHAF2, SDHA (sequence changes only), SDHB, SDHC, SDHD, SMAD4, SMARCA4, SMARCB1, SMARCE1, STK11, SUFU, TERC, TERT, TMEM127, TP53, TSC1, TSC2, VHL, WRN and WT1.    Based on Autumn Roman's personal and family history of cancer,  she meets medical criteria for genetic testing. Despite that she meets criteria, she may still have an out of pocket cost.   PLAN: After considering the risks, benefits, and limitations, Autumn Roman provided informed consent to pursue genetic testing and the blood sample was sent to [facility] for analysis of the Breast Cancer STAT panel + Multi-Cancer Panel. Results should be available within approximately one-two weeks' time, at which point they will be disclosed by telephone to Autumn Roman, as will any additional recommendations warranted by these results. Autumn Roman will receive a summary of her genetic counseling visit and a copy of her results once available. This information will also be available in Epic.   Autumn Roman questions were answered to her satisfaction today. Our contact information was provided should additional questions or concerns arise. Thank you for the referral and allowing [place] to share in the care of your patient.   Clint Guy, [place], [facility] Licensed, Certified Dispensing [email or web address] Phone: [phone]  The patient was seen for a total of 35 minutes in face-to-face genetic counseling.  This patient was discussed with Drs. Magrinat, Lindi Adie and/or Burr Medico who agrees with the above.    _______________________________________________________________________ For Office Staff:  Number of people involved in session: 1 Was an Intern/ student involved with case: no

## 2020-10-01 ENCOUNTER — Other Ambulatory Visit: Payer: Self-pay

## 2020-10-01 ENCOUNTER — Ambulatory Visit (HOSPITAL_COMMUNITY)
Admission: RE | Admit: 2020-10-01 | Discharge: 2020-10-01 | Disposition: A | Discharge status: Home / Self Care | Payer: Federal, State, Local not specified - PPO | Source: Ambulatory Visit | Attending: General Surgery | Admitting: General Surgery

## 2020-10-01 DIAGNOSIS — D0512 Intraductal carcinoma in situ of left breast: Secondary | ICD-10-CM

## 2020-10-01 IMAGING — MR MR BREAST BILAT WO/W CM
6 of 11 series · 27 of 48 positions shown · IV contrast (gadavist)
Comparison: None.

CLINICAL DATA: 49-year-old female with recently diagnosed DCIS of
the left breast x2.

LABS:  None.
EXAM:
BILATERAL BREAST MRI WITH AND WITHOUT CONTRAST
TECHNIQUE: Multiplanar, multisequence MR images of both breasts were obtained
prior to and following the intravenous administration of 8 ml of
Gadavist

[Series 2: T2 · axial · 3.0mm · 0.94mm/px · z∈[-69,+99]mm · 4 of 57 slices shown]
[im 1/57]
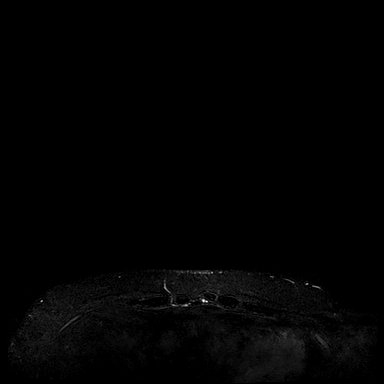
[im 19/57]
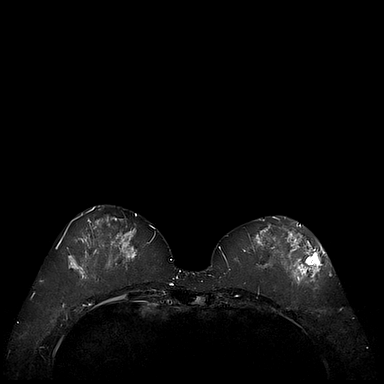
[im 38/57]
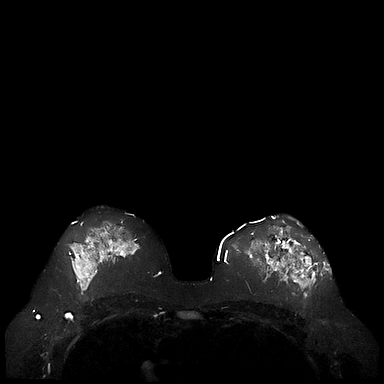
[im 57/57]
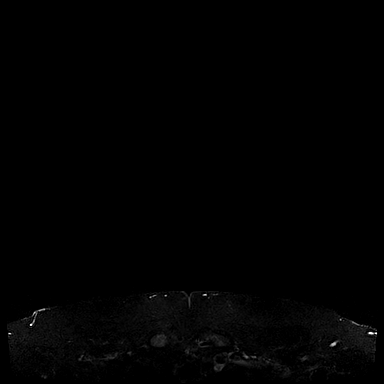

[Series 3: T1 fat-sat · axial · 1.2mm · 0.80mm/px · z∈[-71,+100]mm · 7 of 144 slices shown (1 of 3)]
[im 1/144]
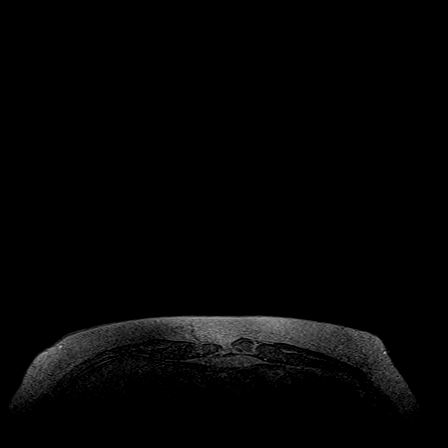
[im 24/144]
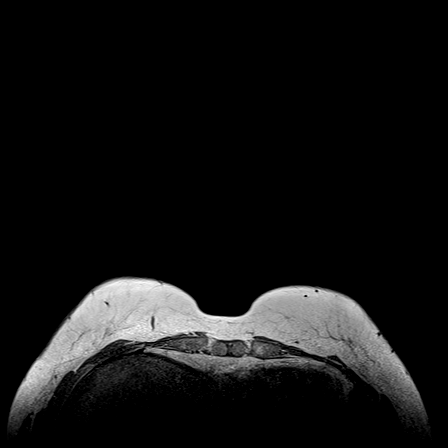
[im 48/144]
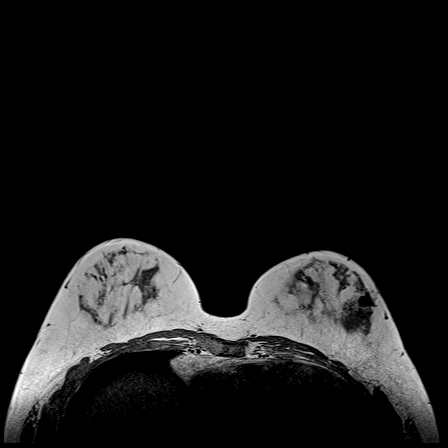
[im 72/144]
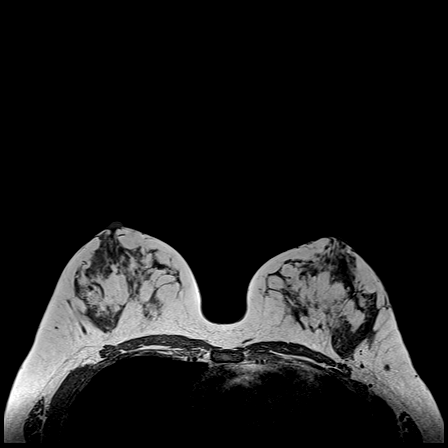
[im 96/144]
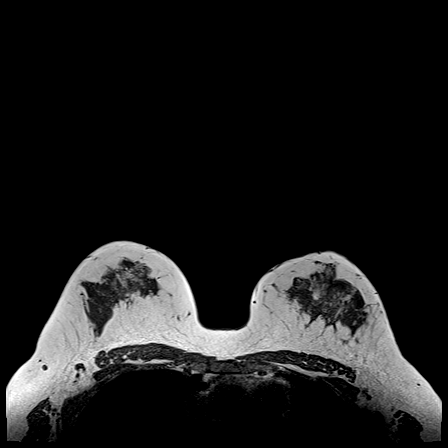
[im 120/144]
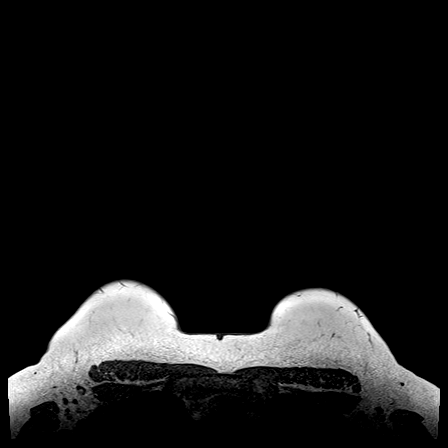
[im 144/144]
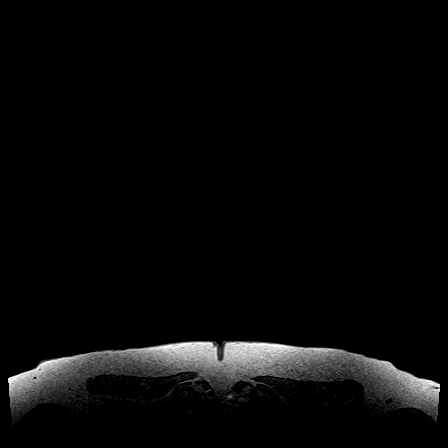

[Series 6: T1 fat-sat · axial · 1.6mm · 0.87mm/px · z∈[-74,+103]mm · 5 of 112 slices shown (2 of 3)]
[im 1/112]
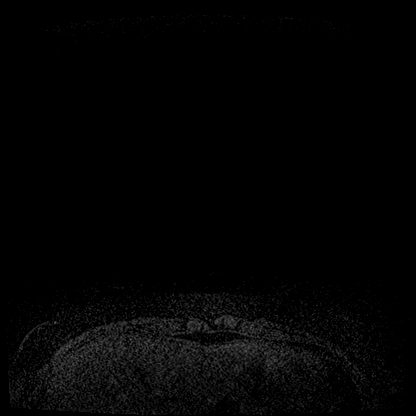
[im 28/112]
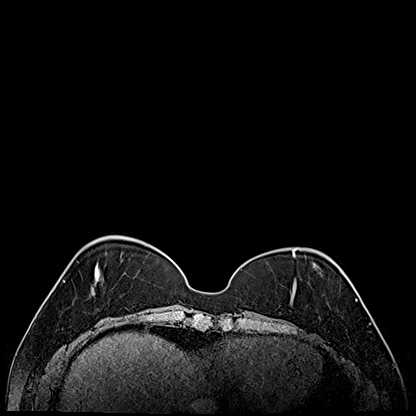
[im 56/112]
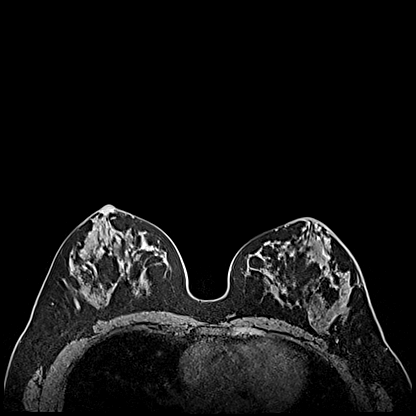
[im 84/112]
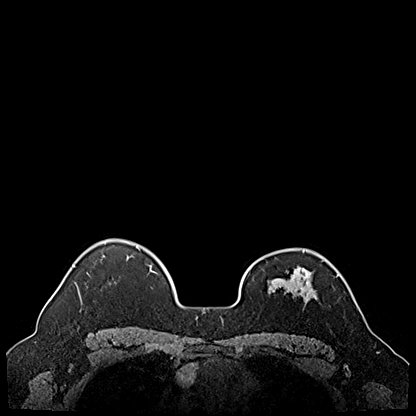
[im 112/112]
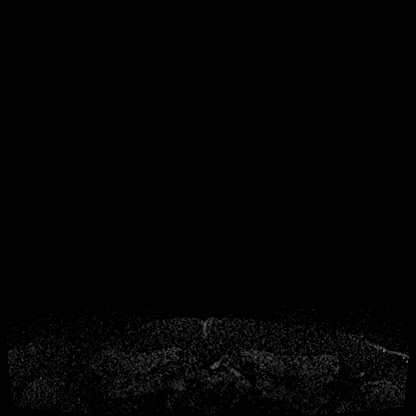

[Series 7: T1 fat-sat · axial · 1.6mm · 0.87mm/px · z∈[-74,+103]mm · 5 of 112 slices shown (3 of 3)]
[im 1/112]
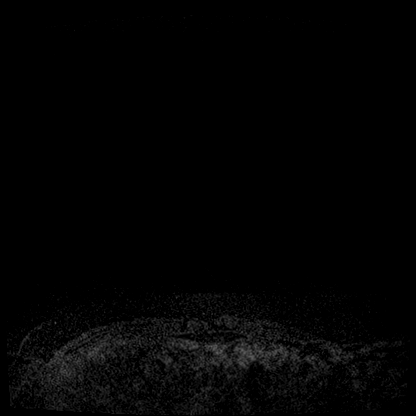
[im 28/112]
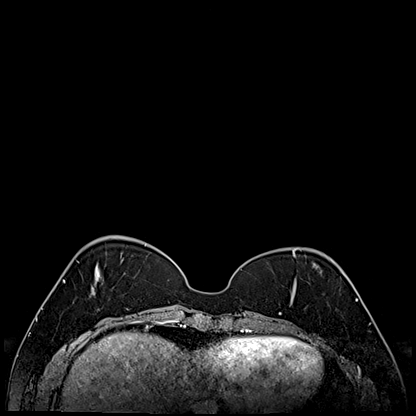
[im 56/112]
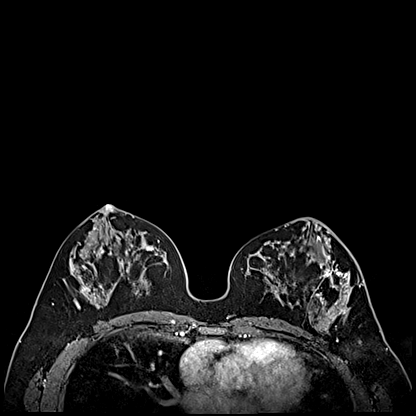
[im 84/112]
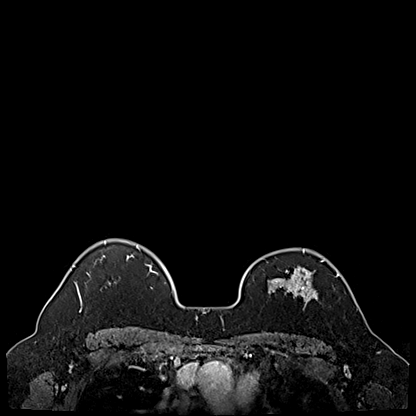
[im 112/112]
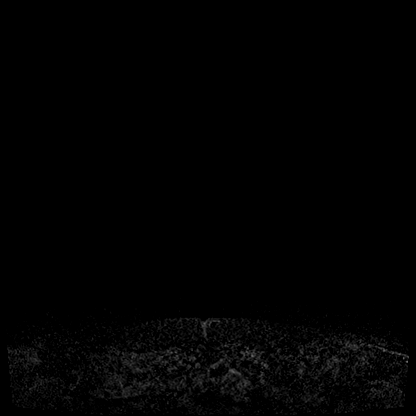

[Series 8: T1 · axial · 1.6mm · 0.87mm/px · z∈[-74,+103]mm · 5 of 112 slices shown (1 of 2)]
[im 1/112]
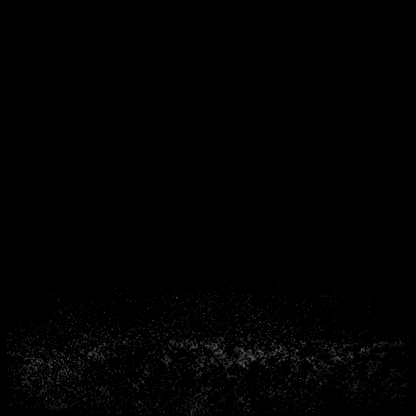
[im 28/112]
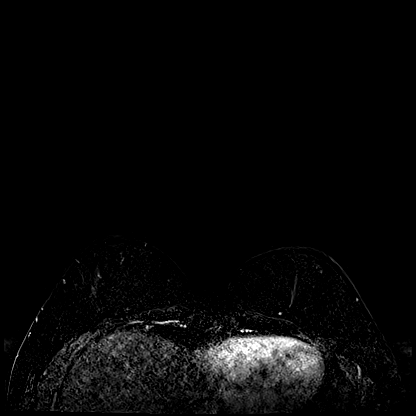
[im 56/112]
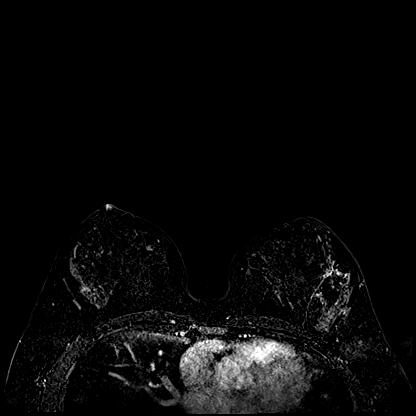
[im 84/112]
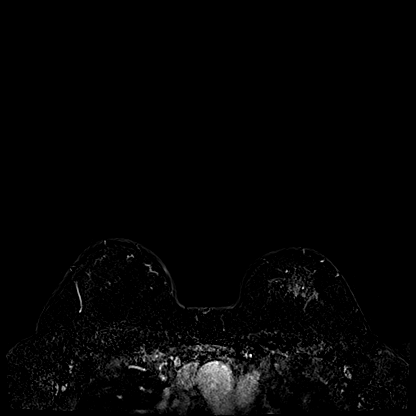
[im 112/112]
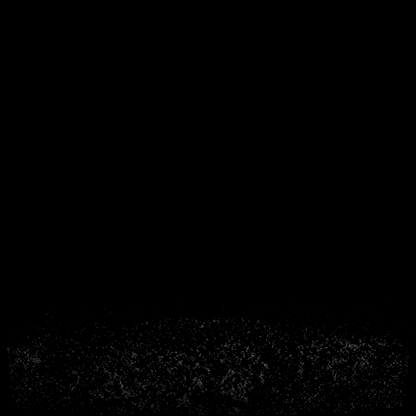

[Series 9: T1 · coronal · 360.0mm · 0.87mm/px · 1 of 3 slices shown (2 of 2)]
[im 1/3]
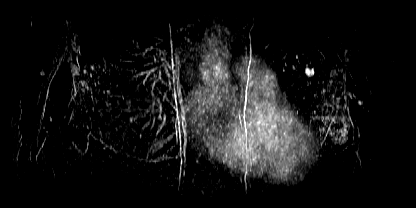

[27 of 48 positions shown; findings below may reference images not displayed]

Three-dimensional MR images were rendered by post-processing of the
original MR data on an independent workstation. The
three-dimensional MR images were interpreted, and findings are
reported in the following complete MRI report for this study. Three
dimensional images were evaluated at the independent interpreting
workstation using the DynaCAD thin client.
FINDINGS: Breast composition: c. Heterogeneous fibroglandular tissue.

Background parenchymal enhancement: Moderate.

Right breast: In the upper-outer right breast there is ill-defined
clumped non mass enhancement which is difficult to measure due to
ill-defined margins but measures approximately 2.0 x 1.6 x 1.2 cm
(series 8, image 45), with similar appearance to patient's known
DCIS.

Left breast: There is subtle susceptibility artifact associated with
biopsy changes in the upper outer left breast (series 3, image 68),
corresponding to the patient's X shaped clip. There is
susceptibility artifact in the upper outer left breast which
corresponds to the patient's coil clip (series 3, image 85). There
is susceptibility artifact in the lower outer left breast (series 3,
image 98), corresponding to the patient's ribbon clip. Throughout
the outer aspect of the left breast there is extensive ill-defined
clumped non mass enhancement which extends from the inferior ribbon
clip superiorly to and just above the X shaped clip. This measures
approximately 5.8 x 2.2 x 3.6 cm and includes the biopsy site of
benign fibrocystic changes. Additionally in the superior aspect of
the left breast there are 3 additional indeterminate enhancing
masses. One of which in the superior central breast with irregular
margins is suspicious measuring approximately 0.8 x 0.7 x 0.8 cm
(series 12, image 39). There is an additional heterogeneous
enhancing mass in the upper outer left breast measuring
approximately 1.1 x 0.9 x 0.9 cm (series 12, image 40). Another
smaller enhancing mass in the posterior superior central left breast
measures 0.5 cm (series 12, image 44).

Lymph nodes: No abnormal appearing lymph nodes.

Ancillary findings:  None.
IMPRESSION: 1. Clumped non mass enhancement in the upper-outer RIGHT breast
which is difficult to measure due to ill-defined margins but
measures approximately 2.0 x 1.6 x 1.2 cm, similar in appearance to
patient's known DCIS.
2. Extensive ill-defined clumped non mass enhancement involving the
outer aspect of the LEFT breast extending from the lower outer
quadrant biopsy-proven site of DCIS (ribbon clip) superiorly to and
just above the biopsy proven DCIS (X clip) in the upper-outer
quadrant. This area includes the third biopsy site of benign
fibrocystic changes. This measures approximately 5.8 x 2.2 x 3.6 cm.
3. There are three additional indeterminate enhancing masses
measuring up to 1.1 cm in the superior aspect of the left breast.
4. No lymphadenopathy.

RECOMMENDATION:
1. Second look ultrasound and possible biopsy for the two larger
masses in the superior left breast. If these cannot be identified
sonographically recommend MRI guided core needle biopsy x 2 of the
two larger masses in the superior left breast.

2. MRI guided biopsy x1 for the non mass enhancement in the upper
outer right breast.

3. If breast conservation of the left breast is pursued recommend
excision of all three biopsied areas, including the benign
fibrocystic changes, given that the suspicious non mass enhancement
extends through this area.

BI-RADS CATEGORY  4: Suspicious.

## 2020-10-01 MED ORDER — GADOBUTROL 1 MMOL/ML IV SOLN
8.0000 mL | Freq: Once | INTRAVENOUS | Status: AC | PRN
  Administered 2020-10-01: 8 mL via INTRAVENOUS

## 2020-10-05 ENCOUNTER — Telehealth: Payer: Self-pay | Admitting: Hematology and Oncology

## 2020-10-05 NOTE — Telephone Encounter (Signed)
Received a new pt referral from Dr. Marlou Starks for DCIS. Ms. Autumn Roman has been cld and scheduled to see Dr. Chryl Heck on 4/12 at 10am. Per pt she needed an appt before 11am. She's been made aware to arrive 20 minutes early.

## 2020-10-06 ENCOUNTER — Other Ambulatory Visit: Payer: Self-pay | Admitting: General Surgery

## 2020-10-06 DIAGNOSIS — R9389 Abnormal findings on diagnostic imaging of other specified body structures: Secondary | ICD-10-CM

## 2020-10-07 ENCOUNTER — Telehealth: Payer: Self-pay | Admitting: Hematology and Oncology

## 2020-10-07 NOTE — Telephone Encounter (Signed)
Pt cld to reschedule her new pt appt w/Dr. Chryl Heck to 4/13 at 1040am.

## 2020-10-09 DIAGNOSIS — Z1379 Encounter for other screening for genetic and chromosomal anomalies: Secondary | ICD-10-CM | POA: Insufficient documentation

## 2020-10-10 ENCOUNTER — Telehealth: Payer: Self-pay | Admitting: Genetic Counselor

## 2020-10-10 ENCOUNTER — Encounter: Payer: Self-pay | Admitting: Genetic Counselor

## 2020-10-10 ENCOUNTER — Ambulatory Visit: Payer: Self-pay | Admitting: Genetic Counselor

## 2020-10-10 DIAGNOSIS — Z1379 Encounter for other screening for genetic and chromosomal anomalies: Secondary | ICD-10-CM

## 2020-10-10 NOTE — Progress Notes (Signed)
HPI:  Autumn Roman was previously seen in the [facility] due to a personal and family history of cancer and concerns regarding a hereditary predisposition to cancer. Please refer to our prior cancer genetics clinic note for more information regarding our discussion, assessment and recommendations, at the time. Autumn Roman recent genetic test results were disclosed to her, as were recommendations warranted by these results. These results and recommendations are discussed in more detail below.  CANCER HISTORY:  Oncology History  Malignant neoplasm of upper-outer quadrant of left breast in female, estrogen receptor positive (HCC)  09/22/2020 Cancer Staging   Staging form: Breast, AJCC 8th Edition - Clinical stage from 09/22/2020: Stage 0 (cTis (DCIS), cN0, cM0, ER+, PR+) - Signed by Loa Socks, NP on 09/28/2020   09/28/2020 Initial Diagnosis   Malignant neoplasm of upper-outer quadrant of left breast in female, estrogen receptor positive (HCC)   10/09/2020 Genetic Testing   Negative genetic testing:  No pathogenic variants detected on the [facility] Breast Cancer STAT panel or Multi-Cancer panel. A variant of uncertain significance (VUS) was detected in the [facility] gene called c.5005_5006delinsTT (p.Glu1669Leu). The report date is 10/09/2020.  The Breast Cancer STAT Panel offered by [facility] includes sequencing and deletion/duplication analysis for the following 9 genes:  ATM, BRCA1, BRCA2, CDH1, CHEK2, PALB2, PTEN, STK11 and TP53. The Multi-Cancer Panel offered by [facility] includes sequencing and/or deletion duplication testing of the following 84 genes: AIP, ALK, APC, ATM, AXIN2,BAP1,  BARD1, BLM, BMPR1A, BRCA1, BRCA2, BRIP1, CASR, CDC73, CDH1, CDK4, CDKN1B, CDKN1C, CDKN2A (p14ARF), CDKN2A (p16INK4a), CEBPA, CHEK2, CTNNA1, DICER1, DIS3L2, EGFR (c.2369C>T, p.Thr790Met variant only), EPCAM (Deletion/duplication testing only), FH, FLCN, GATA2, GPC3, GREM1 (Promoter region  deletion/duplication testing only), HOXB13 (c.251G>A, p.Gly84Glu), HRAS, KIT, MAX, MEN1, MET, MITF (c.952G>A, p.Glu318Lys variant only), MLH1, MSH2, MSH3, MSH6, MUTYH, NBN, NF1, NF2, NTHL1, PALB2, PDGFRA, PHOX2B, PMS2, POLD1, POLE, POT1, PRKAR1A, PTCH1, PTEN, RAD50, RAD51C, RAD51D, RB1, RECQL4, RET, RUNX1, SDHAF2, SDHA (sequence changes only), SDHB, SDHC, SDHD, SMAD4, SMARCA4, SMARCB1, SMARCE1, STK11, SUFU, TERC, TERT, TMEM127, TP53, TSC1, TSC2, VHL, WRN and WT1.        FAMILY HISTORY:  We obtained a detailed, 4-generation family history.  Significant diagnoses are listed below: Family History  Problem Relation Age of Onset  . Brain cancer Mother        dx 97s  . Breast cancer Maternal Aunt        bilateral mastectomies, dx late 72s  . Melanoma Father        dx 62s  . Breast cancer Paternal Aunt   . Melanoma Paternal Uncle        dx 62s  . Esophageal cancer Paternal Grandmother        dx 25s  . Breast cancer Cousin        dx 46s or 73s (paternal first cousin)    Autumn Roman has one adopted daughter (age 32) and one adopted son (age 72) - neither are biologically related to her. She has one brother and three sisters, all in their 32s and 82s.   Autumn Roman mother is alive at age 14 and has a history of brain cancer diagnosed in her 90s. There is one maternal aunt, who has a history of breast cancer treated with bilateral mastectomies (diagnosed in her late 78s). There is no known cancer among maternal cousins. Autumn Roman maternal grandmother died at age 86 without cancer. Her maternal grandfather died in his late 69s without cancer.   Autumn Roman's father  is alive at age 35 and has a history of spindle cell melanoma diagnosed on his head in his 29s. There were four paternal aunts and one paternal uncle. One aunt has a history of breast cancer (unknown age of diagnosis). One uncle was first diagnosed with melanoma in his 31s, and had a recurrence more recently (specific age unknown). One  paternal cousin was diagnosed with breast cancer in her 62s or 5s. Autumn Roman paternal grandmother died in her 69s from esophageal cancer. Her paternal grandfather died in his 42s without cancer.  Autumn Roman is unaware of previous family history of genetic testing for hereditary cancer risks. Patient's maternal ancestors are of [place], and [place] descent, and paternal ancestors are of [place] descent. There is no reported Ashkenazi Jewish ancestry. There is no known consanguinity.  GENETIC TEST RESULTS: Genetic testing reported out on 10/09/2020 through the [facility] Breast Cancer STAT panel and Multi-Cancer panel. No pathogenic variants were detected.   The Breast Cancer STAT Panel offered by [facility] includes sequencing and deletion/duplication analysis for the following 9 genes:  ATM, BRCA1, BRCA2, CDH1, CHEK2, PALB2, PTEN, STK11 and TP53. The Multi-Cancer Panel offered by [facility] includes sequencing and/or deletion duplication testing of the following 84 genes: AIP, ALK, APC, ATM, AXIN2,BAP1,  BARD1, BLM, BMPR1A, BRCA1, BRCA2, BRIP1, CASR, CDC73, CDH1, CDK4, CDKN1B, CDKN1C, CDKN2A (p14ARF), CDKN2A (p16INK4a), CEBPA, CHEK2, CTNNA1, DICER1, DIS3L2, EGFR (c.2369C>T, p.Thr790Met variant only), EPCAM (Deletion/duplication testing only), FH, FLCN, GATA2, GPC3, GREM1 (Promoter region deletion/duplication testing only), HOXB13 (c.251G>A, p.Gly84Glu), HRAS, KIT, MAX, MEN1, MET, MITF (c.952G>A, p.Glu318Lys variant only), MLH1, MSH2, MSH3, MSH6, MUTYH, NBN, NF1, NF2, NTHL1, PALB2, PDGFRA, PHOX2B, PMS2, POLD1, POLE, POT1, PRKAR1A, PTCH1, PTEN, RAD50, RAD51C, RAD51D, RB1, RECQL4, RET, RUNX1, SDHAF2, SDHA (sequence changes only), SDHB, SDHC, SDHD, SMAD4, SMARCA4, SMARCB1, SMARCE1, STK11, SUFU, TERC, TERT, TMEM127, TP53, TSC1, TSC2, VHL, WRN and WT1. The test report will be scanned into EPIC and located under the Molecular Pathology section of the Results Review tab.  A portion of the result report is included  below for reference.     We discussed with [name] that because current genetic testing is not perfect, it is possible there may be a gene mutation in one of these genes that current testing cannot detect, but that chance is small.  We also discussed that there could be another gene that has not yet been discovered, or that we have not yet tested, that is responsible for the cancer diagnoses in the family. It is also possible there is a hereditary cause for the cancer in the family that Ms. Woodrow did not inherit and therefore was not identified in her testing.  Therefore, it is important to remain in touch with cancer genetics in the future so that we can continue to offer Ms. Shedden the most up to date genetic testing.   Genetic testing did identify a variant of uncertain significance (VUS) in the [facility] gene called c.5005_5006delinsTT (p.Glu1669Leu). At this time, it is unknown if this variant is associated with increased cancer risk or if this is a normal finding, but most variants such as this get reclassified to being inconsequential. It should not be used to make medical management decisions. With time, we suspect the lab will determine the significance of this variant, if any. If we do learn more about it, we will try to contact Ms. Stull to discuss it further. However, it is important to stay in touch with [place] periodically and keep the address and phone  number up to date.   CANCER SCREENING RECOMMENDATIONS: Ms. Mainwaring test result is considered negative (normal).  This means that we have not identified a hereditary cause for her personal and family history of cancer at this time. While reassuring, this does not definitively rule out a hereditary predisposition to cancer. It is still possible that there could be genetic mutations that are undetectable by current technology. There could be genetic mutations in genes that have not been tested or identified to increase cancer risk.  Therefore, it  is recommended she continue to follow the cancer management and screening guidelines provided by her oncology and primary healthcare provider.   An individual's cancer risk and medical management are not determined by genetic test results alone. Overall cancer risk assessment incorporates additional factors, including personal medical history, family history, and any available genetic information that may result in a personalized plan for cancer prevention and surveillance.  RECOMMENDATIONS FOR FAMILY MEMBERS:  Individuals in this family might be at some increased risk of developing cancer, over the general population risk, simply due to the family history of cancer.  We recommended women in this family have a yearly mammogram beginning at age 35, or 71 years younger than the earliest onset of cancer, an annual clinical breast exam, and perform monthly breast self-exams. Women in this family should also have a gynecological exam as recommended by their primary provider. All family members should be referred for colonoscopy starting at age 30.  It is also possible there is a hereditary cause for the cancer in Ms. Thurston's family that she did not inherit and therefore was not identified in her.  Based on Ms. Niehaus's family history, we recommended her family members who were diagnosed with breast cancer, especially those diagnosed at younger ages, have genetic counseling and testing. Ms. Heying will let us know if we can be of any assistance in coordinating genetic counseling and/or testing for these family members.   FOLLOW-UP: Lastly, we discussed with Ms. Frey that cancer genetics is a rapidly advancing field and it is possible that new genetic tests will be appropriate for her and/or her family members in the future. We encouraged her to remain in contact with cancer genetics on an annual basis so we can update her personal and family histories and let her know of advances in cancer genetics that may  benefit this family.   Our contact number was provided. Ms. Usery questions were answered to her satisfaction, and she knows she is welcome to call us at anytime with additional questions or concerns.   Clint Guy, MS, [facility] Genetic Counselor [email or web address] Phone: [phone]

## 2020-10-10 NOTE — Telephone Encounter (Signed)
Revealed negative genetic testing. Discussed that we do not know why she has had cancer or why there is cancer in the family. There could be a genetic mutation in the family that Ms. Waggoner did not inherit. There could also be a mutation in a different gene that we are not testing, or our current technology may not be able to detect certain mutations. It will therefore be important for her to stay in contact with genetics to keep up with whether additional testing may be appropriate in the future.   A variant of uncertain significance was detected in the [facility] gene called c.5005_5006delinsTT (p.Glu1669Leu). Her result is still considered normal at this time and should not impact her medical management.

## 2020-10-11 ENCOUNTER — Ambulatory Visit
Admission: RE | Admit: 2020-10-11 | Discharge: 2020-10-11 | Disposition: A | Discharge status: Home / Self Care | Payer: Federal, State, Local not specified - PPO | Source: Ambulatory Visit | Attending: General Surgery | Admitting: General Surgery

## 2020-10-11 ENCOUNTER — Inpatient Hospital Stay: Payer: Federal, State, Local not specified - PPO | Admitting: Hematology and Oncology

## 2020-10-11 ENCOUNTER — Other Ambulatory Visit: Payer: Self-pay

## 2020-10-11 ENCOUNTER — Inpatient Hospital Stay: Payer: Federal, State, Local not specified - PPO

## 2020-10-11 DIAGNOSIS — R9389 Abnormal findings on diagnostic imaging of other specified body structures: Secondary | ICD-10-CM

## 2020-10-11 IMAGING — MG MM BREAST LOCALIZATION CLIP
6 series · 6 of 18 positions shown · non-contrast
Comparison: Previous exam(s).

CLINICAL DATA: Status post ultrasound-guided core biopsy of 2
masses in the LEFT breast, in the 12 o'clock and 1 o'clock
locations. Patient has had 3 recent stereotactic biopsies of the
LEFT breast.

EXAM:
DIAGNOSTIC LEFT MAMMOGRAM POST ULTRASOUND BIOPSY x2

[L CC synth-2D]
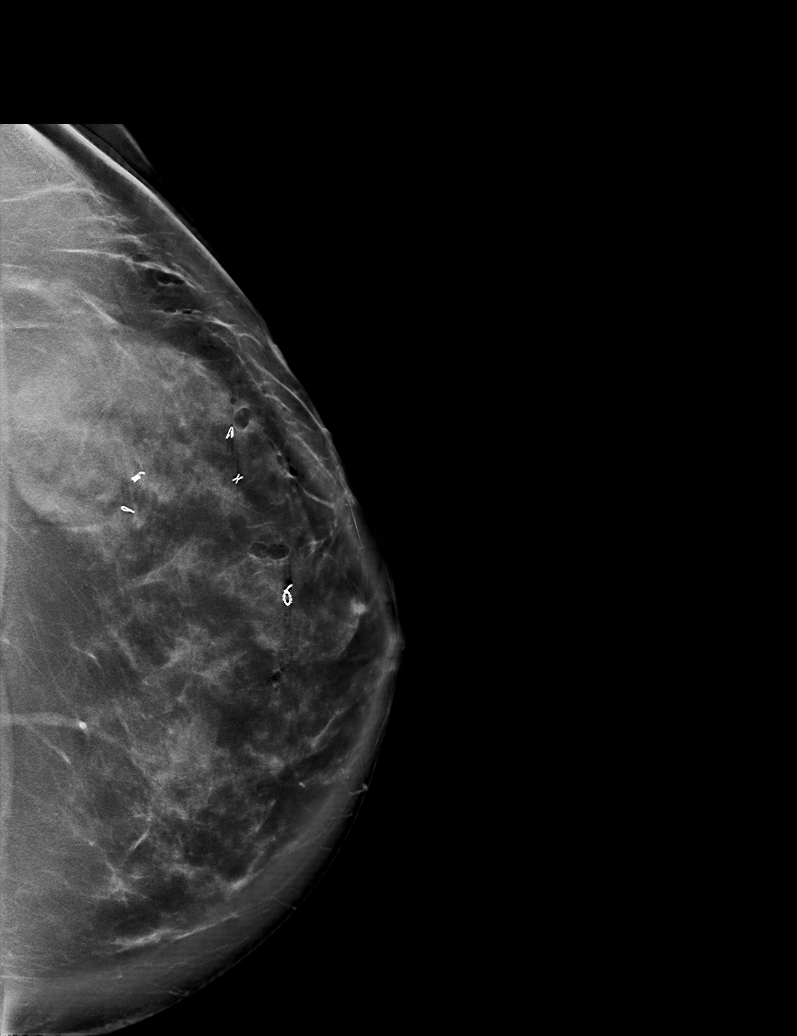

[L LM synth-2D]
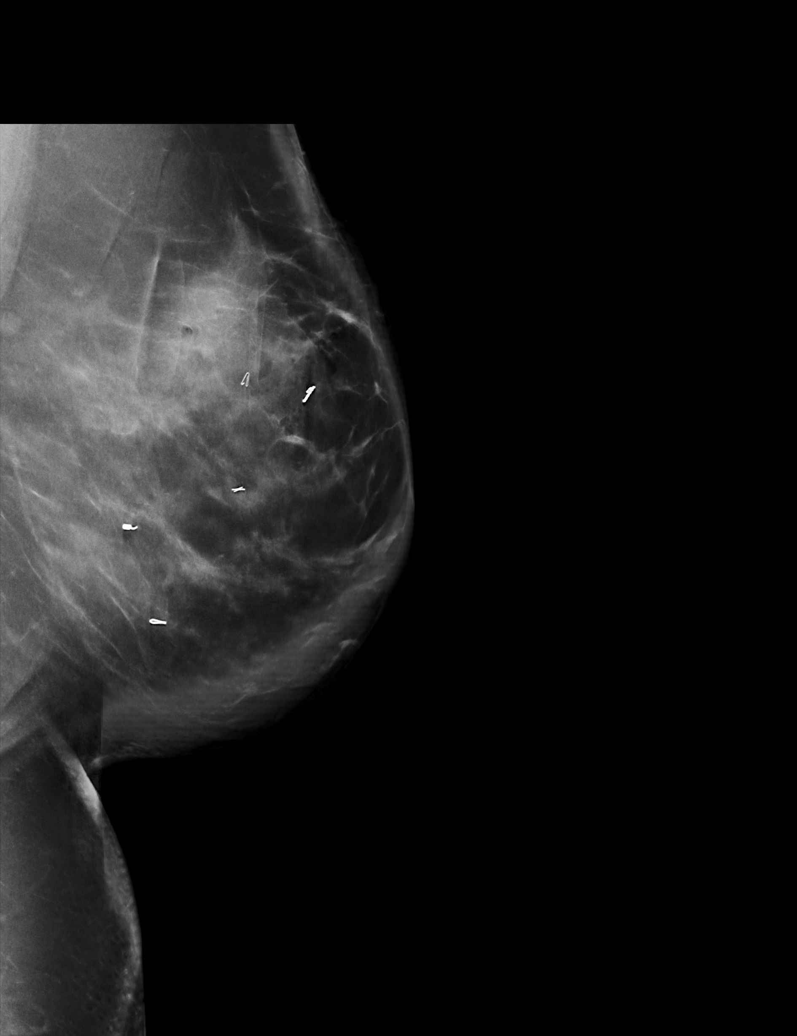

[L ML synth-2D]
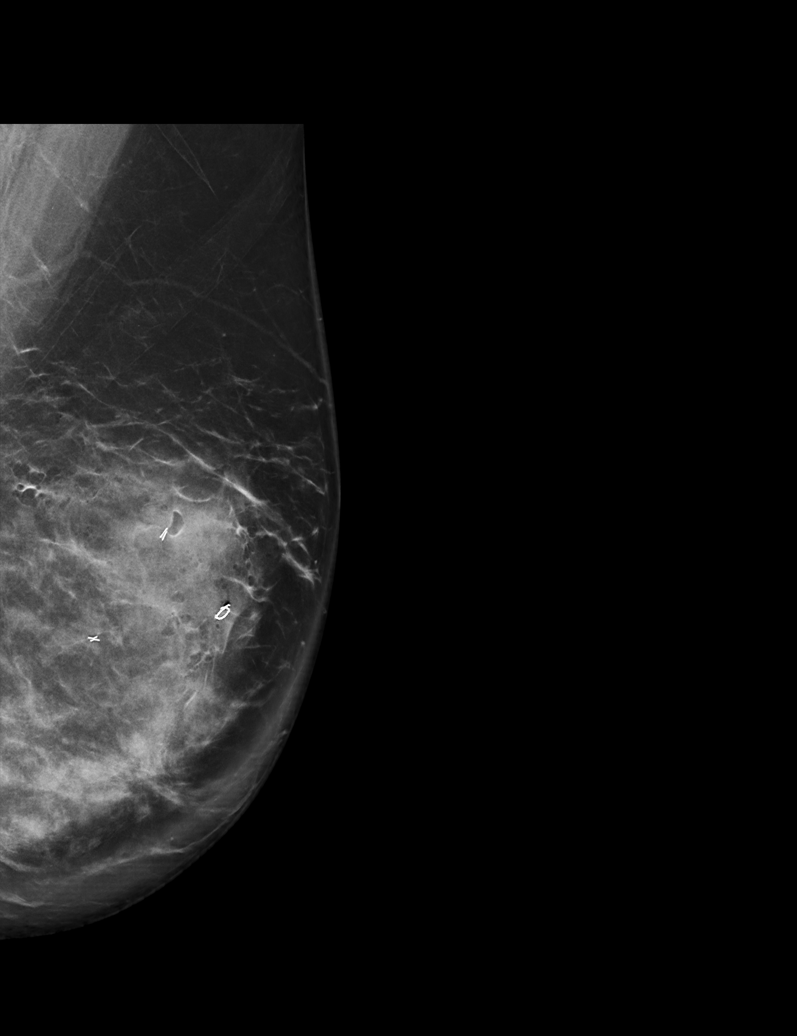

[L CC tomo · tomo slice 63/124.0]
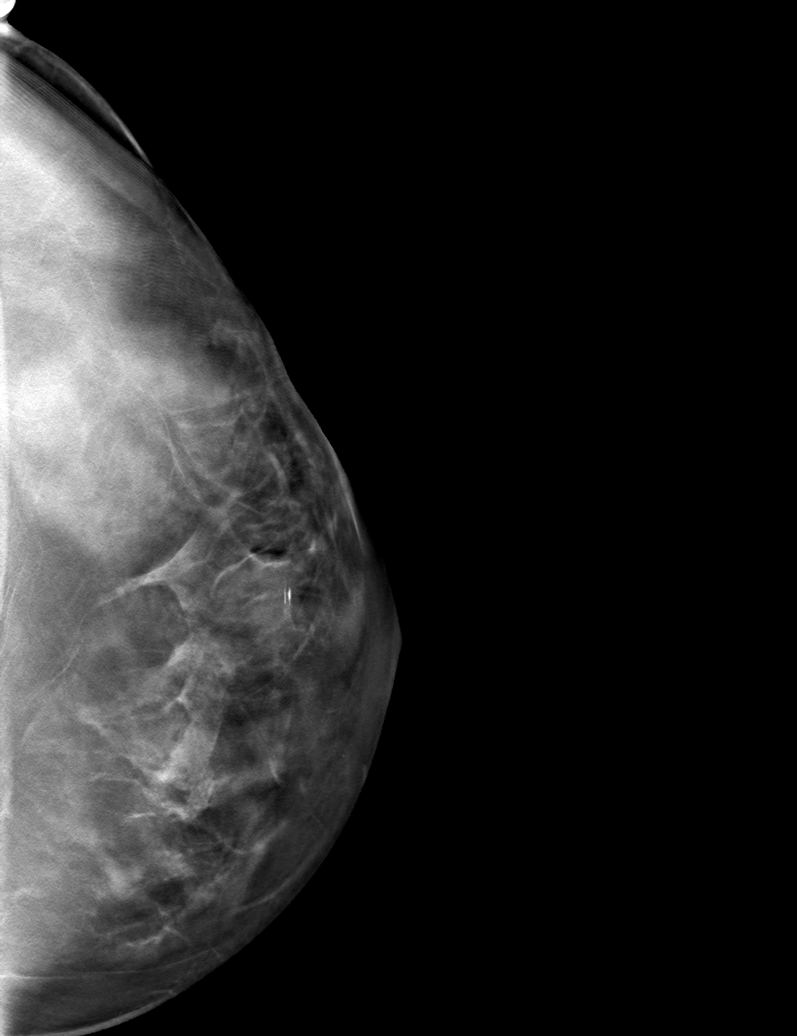

[L ML tomo · tomo slice 53/105.0]
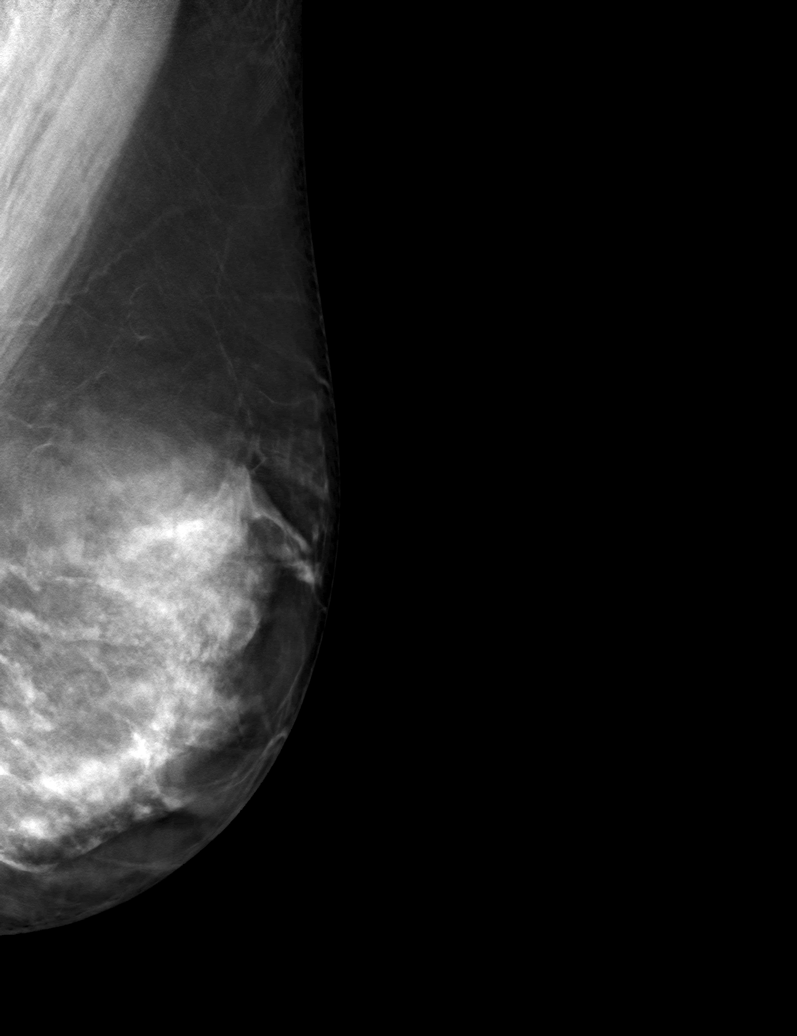

[L LM tomo · tomo slice 67/132.0]
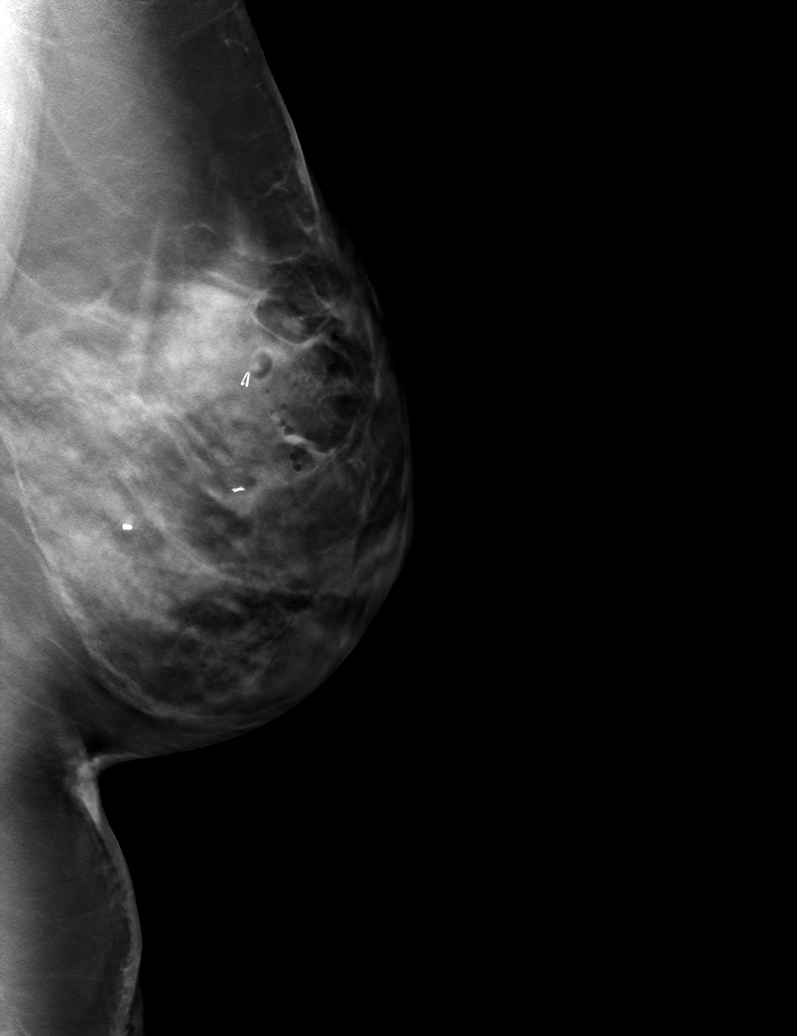

[6 of 18 positions shown; findings below may reference images not displayed]

FINDINGS: Mammographic images were obtained following ultrasound guided biopsy
of mass in the 12 o'clock location of the LEFT breast and placement
of a acute shaped clip. The biopsy marking clip is in expected
position at the site of biopsy.

Following biopsy of mass in the 1 o'clock location of the LEFT
breast, a heart shaped clip was placed and is identified within the
expected location.
IMPRESSION: Tissue marker clips are in the expected locations after biopsy.

Final Assessment: Post Procedure Mammograms for Marker Placement

## 2020-10-11 IMAGING — US US BREAST BX W LOC DEV 1ST LESION IMG BX SPEC US GUIDE*L*
1 series · 16 of 19 positions shown · non-contrast
Comparison: Previous exam(s).
COMPARISON: Previous exam(s).

Addendum:
CLINICAL DATA: Two suspicious masses in the LEFT breast seen on
recent MRI and ultrasound. Recent diagnosis of ductal carcinoma in
situ in the LEFT breast.

EXAM:
ULTRASOUND GUIDED LEFT BREAST CORE NEEDLE BIOPSY

[Series 1: us breast bx w loc dev 1st lesion img bx spec us g · 0.07mm/px · 16 of 19 slices shown]
[im 1/19]
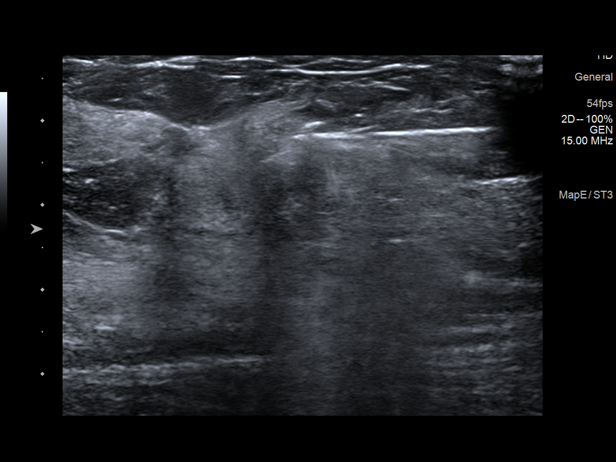
[im 2/19]
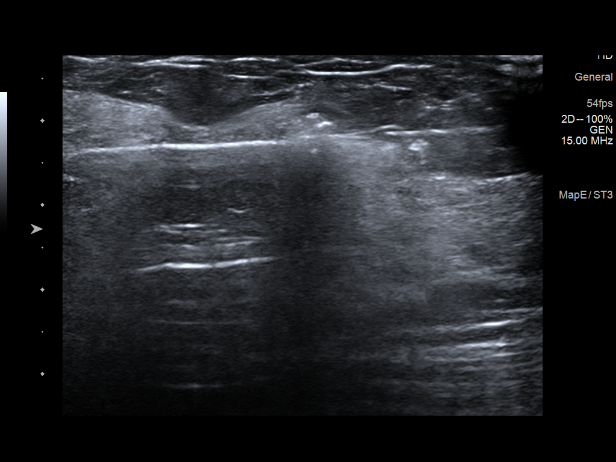
[im 3/19]
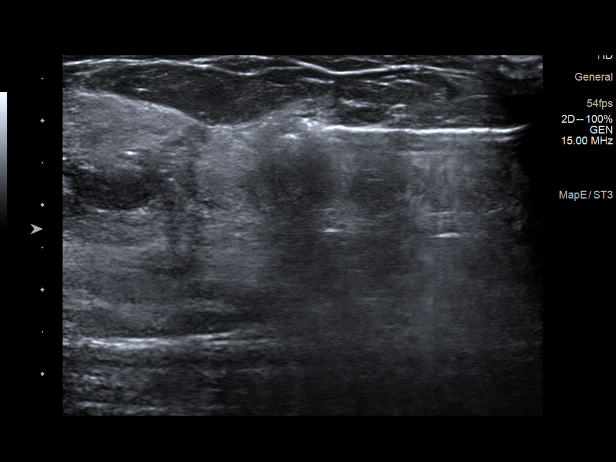
[im 5/19]
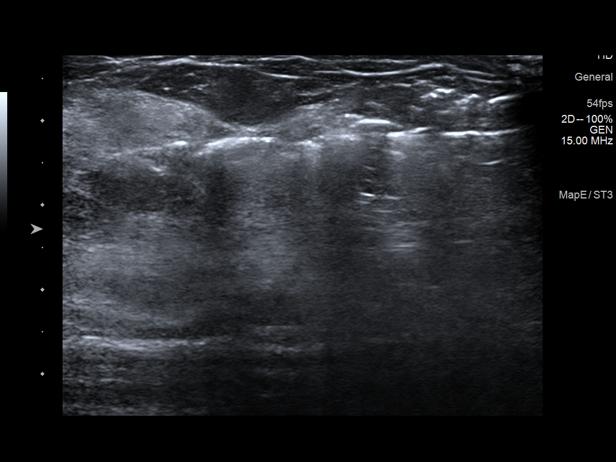
[im 6/19]
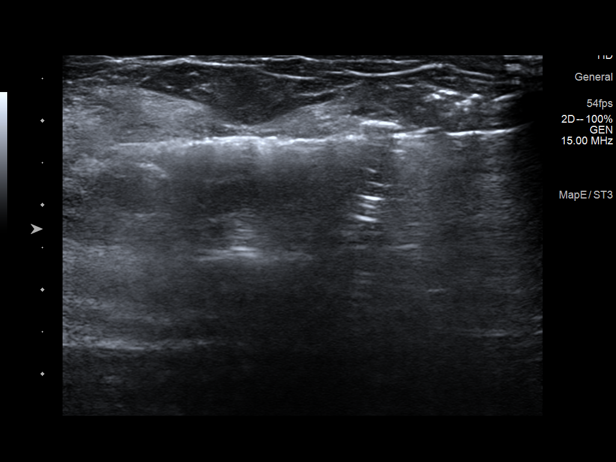
[im 7/19]
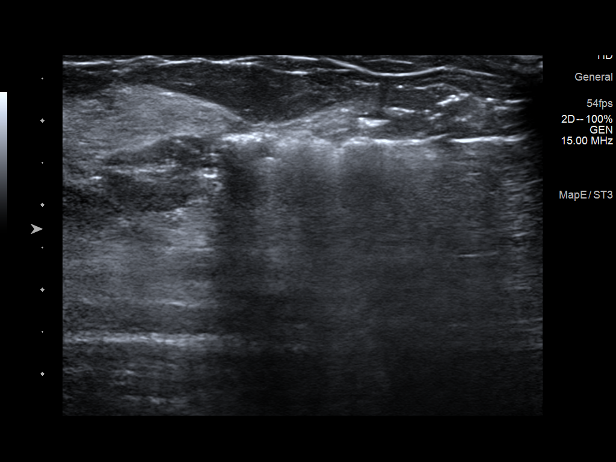
[im 8/19]
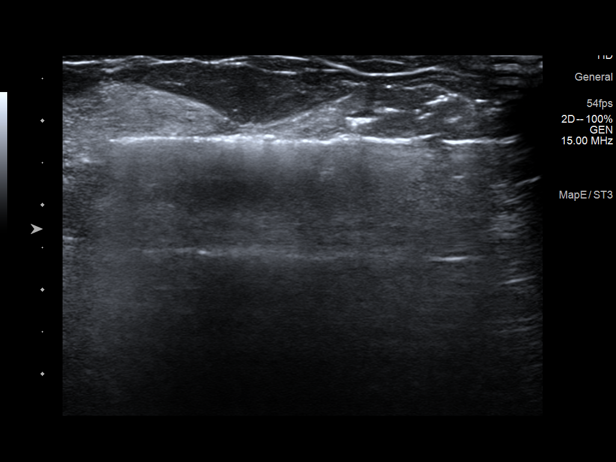
[im 9/19]
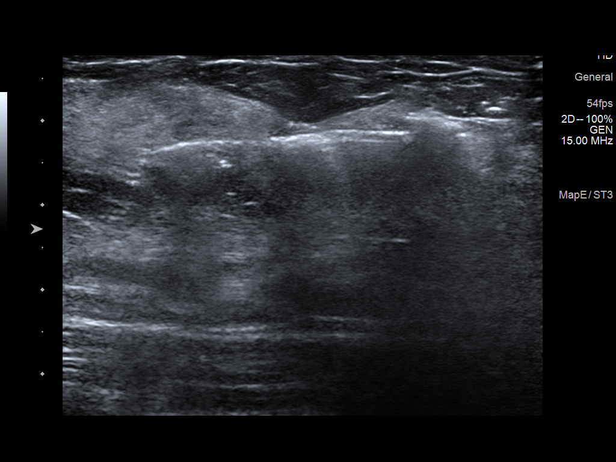
[im 11/19]
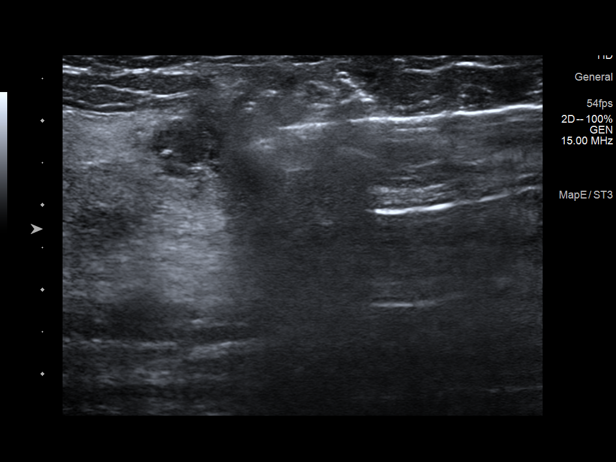
[im 12/19]
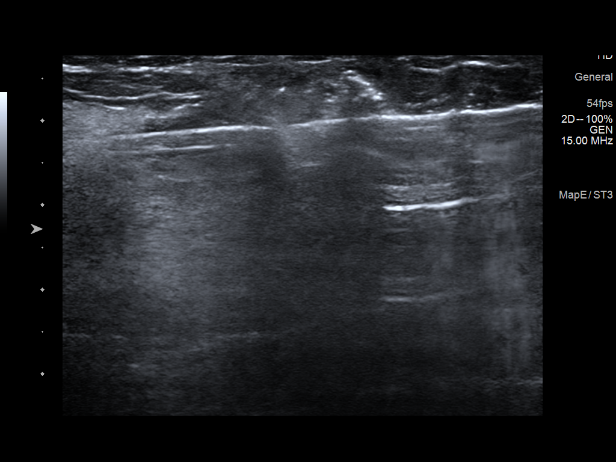
[im 13/19]
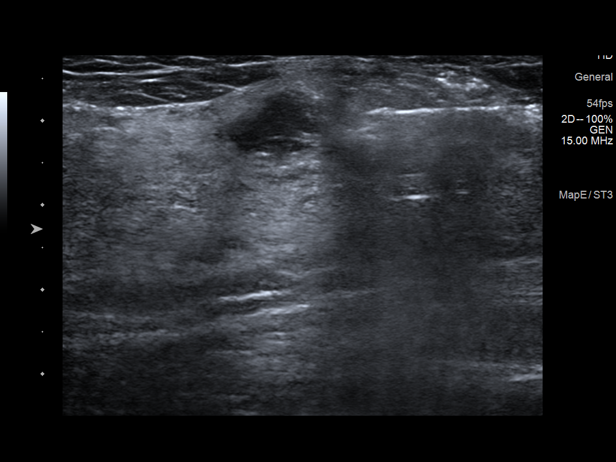
[im 14/19]
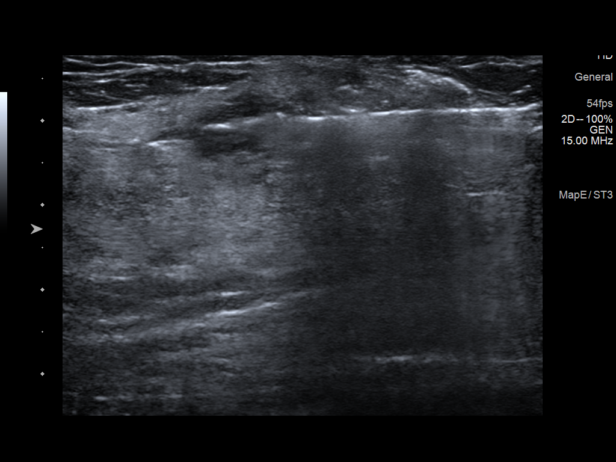
[im 15/19]
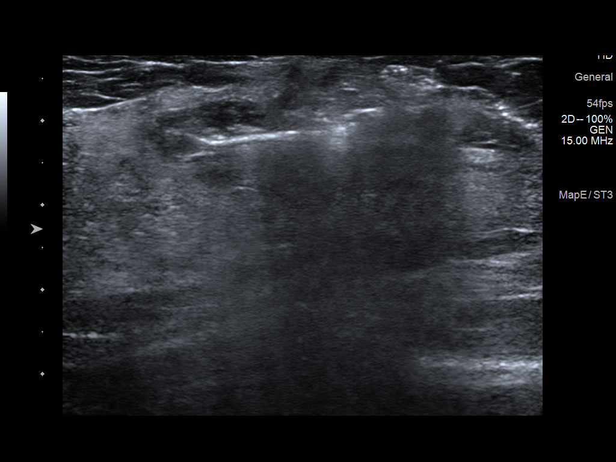
[im 17/19]
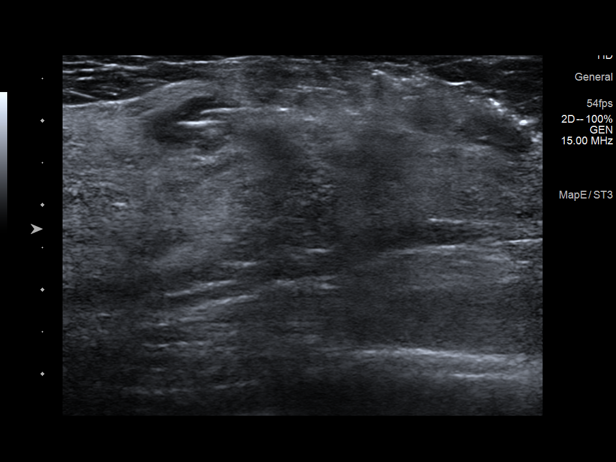
[im 18/19]
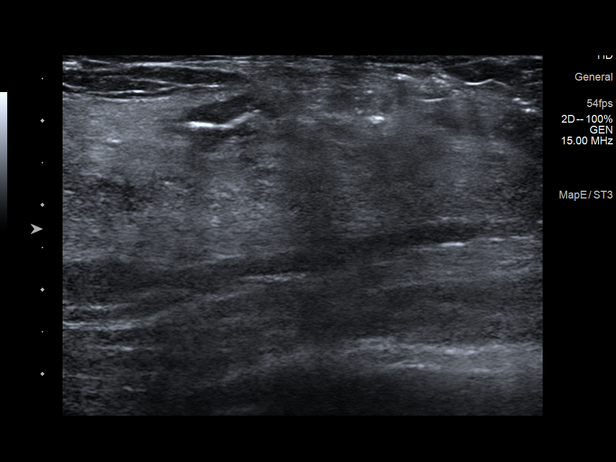
[im 19/19]
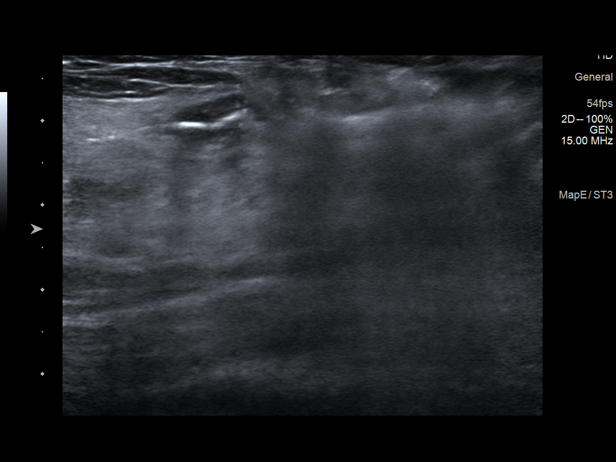

[16 of 19 positions shown; findings below may reference images not displayed]



Site 1: 12 o'clock location LEFT breast. Lesion quadrant: UPPER
central LEFT breast, Q clip

Using sterile technique and 1% lidocaine and 1% lidocaine with
epinephrine as local anesthetic, under direct ultrasound
visualization, a 12 gauge coaxial RELEBOHILE device was used to
perform biopsy of subtle distortion in the 12 o'clock location of
the LEFT breast using a LATERAL to MEDIAL approach. At the
conclusion of the procedure Q tissue marker clip was deployed into
the biopsy cavity.

Site 2: 1 o'clock location LEFT breast. Lesion quadrant: UPPER-OUTER
QUADRANT LEFT breast, heart shaped clip

Using sterile technique and 1% lidocaine and 1% lidocaine with
epinephrine as local anesthetic, under direct ultrasound
visualization, a 12 gauge coaxial RELEBOHILE device was used to
perform biopsy of mass in the 1 o'clock location of the LEFT breast
using a LATERAL to MEDIAL approach. At the conclusion of the
procedure heart shaped tissue marker clip was deployed into the
biopsy cavity.

Follow-up 2-view mammogram was performed and dictated separately.
IMPRESSION: Ultrasound guided biopsy of 2 masses in the LEFT breast. No apparent
complications.

ADDENDUM:
Pathology revealed GRADE I INVASIVE DUCTAL CARCINOMA, DUCTAL
CARCINOMA IN SITU of the LEFT breast, 12 o'clock, [6O], (Q clip).
This was found to be concordant by Dr. RELEBOHILE.

Pathology revealed FIBROADENOMA of the LEFT breast, 1 o'clock,
[6O], (heart clip). This was found to be concordant by Dr.
RELEBOHILE.

Pathology results were discussed with the patient by telephone. The
patient reported doing well after the biopsies with tenderness at
the sites. Post biopsy instructions and care were reviewed and
questions were answered. The patient was encouraged to call The
direct phone number was provided.

The patient has a recent diagnosis of LEFT breast cancer and should
follow her outlined treatment plan.

The patient will be scheduled for a RIGHT breast MRI guided biopsy.
Request for MRI biopsy appointment sent to RELEBOHILE, Patient
Access Supervisor, on [DATE]. Further recommendations will
be guided by the results of this biopsy.

Dr. RELEBOHILE was notified of biopsy results and recommendations via
[REDACTED] message on [DATE].

Pathology results reported by RELEBOHILE, RN on [DATE].



Site 1: 12 o'clock location LEFT breast. Lesion quadrant: UPPER
central LEFT breast, Q clip

Using sterile technique and 1% lidocaine and 1% lidocaine with
epinephrine as local anesthetic, under direct ultrasound
visualization, a 12 gauge coaxial RELEBOHILE device was used to
perform biopsy of subtle distortion in the 12 o'clock location of
the LEFT breast using a LATERAL to MEDIAL approach. At the
conclusion of the procedure Q tissue marker clip was deployed into
the biopsy cavity.

Site 2: 1 o'clock location LEFT breast. Lesion quadrant: UPPER-OUTER
QUADRANT LEFT breast, heart shaped clip

Using sterile technique and 1% lidocaine and 1% lidocaine with
epinephrine as local anesthetic, under direct ultrasound
visualization, a 12 gauge coaxial RELEBOHILE device was used to
perform biopsy of mass in the 1 o'clock location of the LEFT breast
using a LATERAL to MEDIAL approach. At the conclusion of the
procedure heart shaped tissue marker clip was deployed into the
biopsy cavity.

Follow-up 2-view mammogram was performed and dictated separately.
IMPRESSION: Ultrasound guided biopsy of 2 masses in the LEFT breast. No apparent
complications.

## 2020-10-11 IMAGING — US US BREAST*L* LIMITED INC AXILLA
1 series · 13 of 13 positions shown · non-contrast
Comparison: MRI on [DATE] and earlier studies

CLINICAL DATA: Patient presents for evaluation of the LEFT breast.
Recent stereotactic guided core biopsies show ductal carcinoma in
situ of 2 sites, in the UPPER OUTER QUADRANT and LOWER OUTER
quadrants. A third biopsy of calcifications shows fibrocystic
change. However, this biopsy site is in the region of suspicious
changes on MRI, prompting the recommendation for excision of the
third site (marked with a coil shaped clip).

On recent MRI, there are 2 masses in the UPPER portion of the LEFT
breast at anterior depth. A possible third mass is also seen in the
UPPER central LEFT breast, posterior depth.
EXAM:
ULTRASOUND OF THE LEFT BREAST

[Series 1: us breast*left* limited inc axilla · 0.06mm/px · 13 acquisitions, 13 frames shown]
[im 1/13]
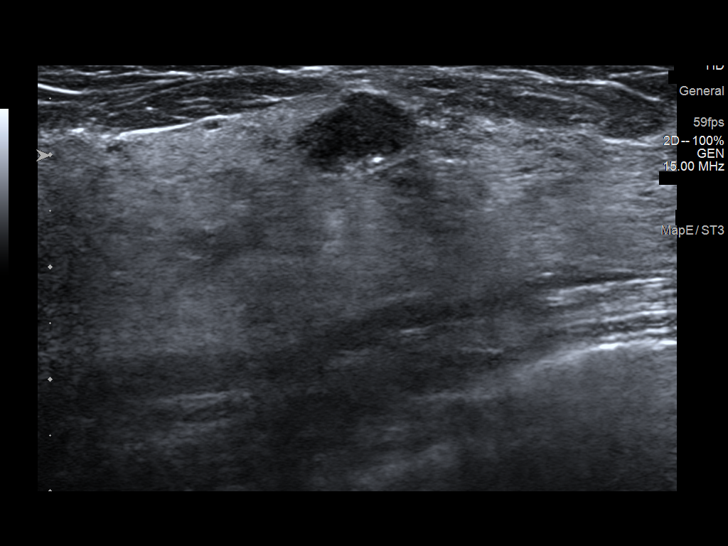
[im 2/13]
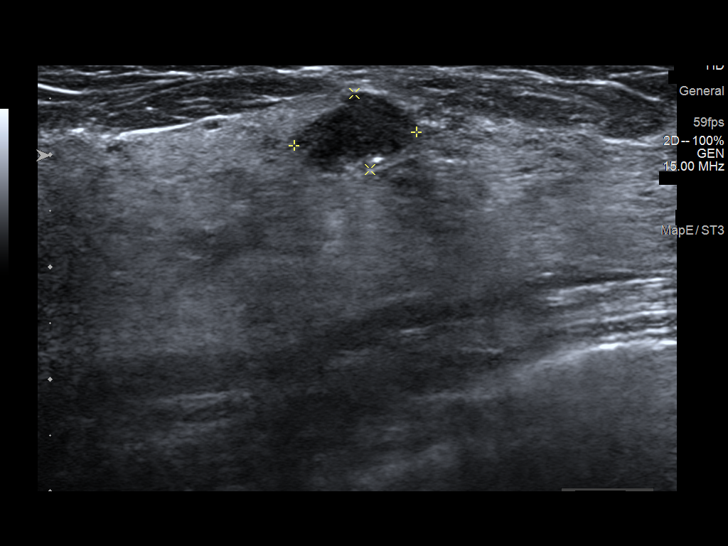
[im 3/13]
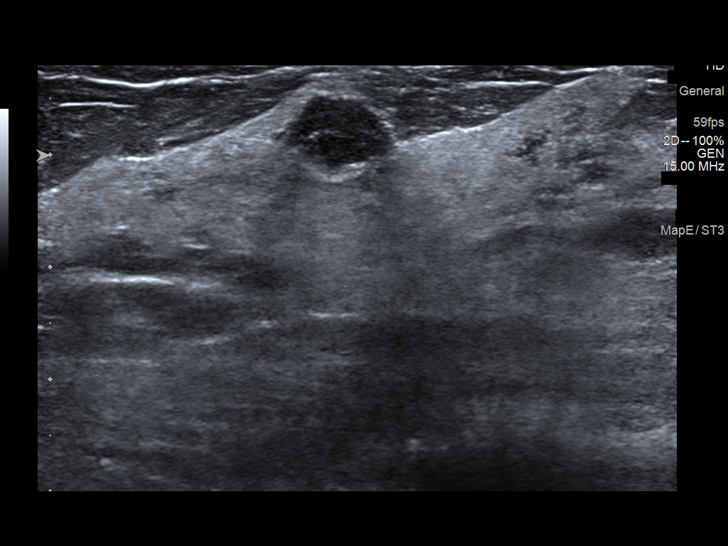
[im 4/13]
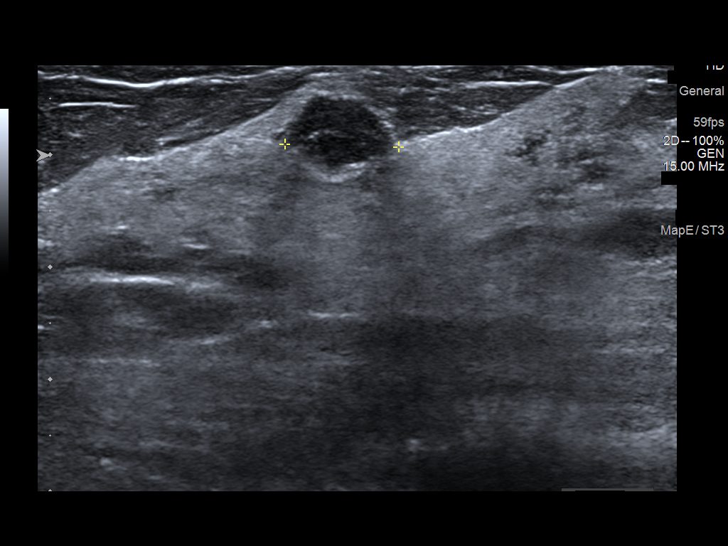
[im 5/13]
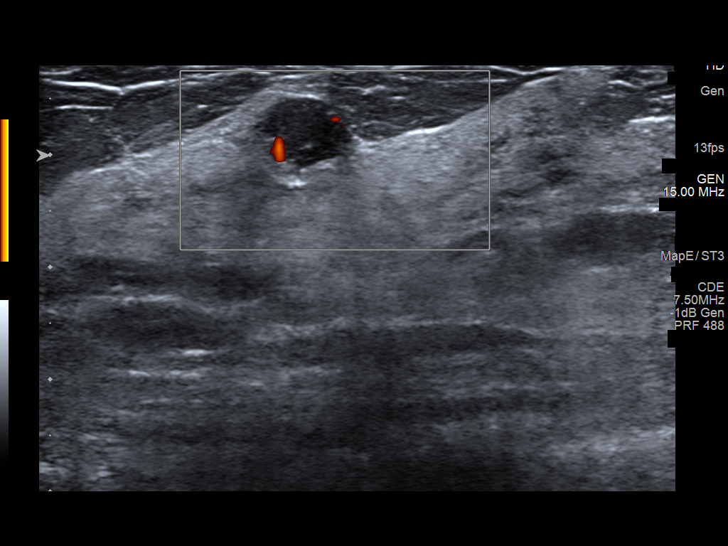
[im 6/13]
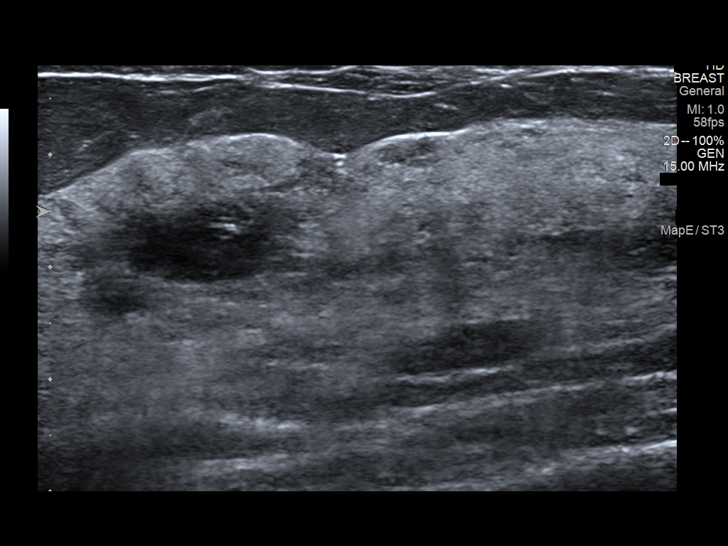
[im 7/13]
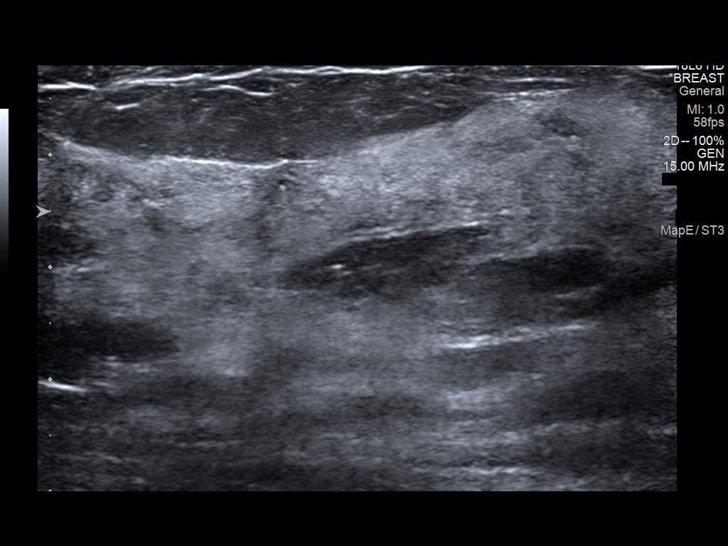
[im 8/13]
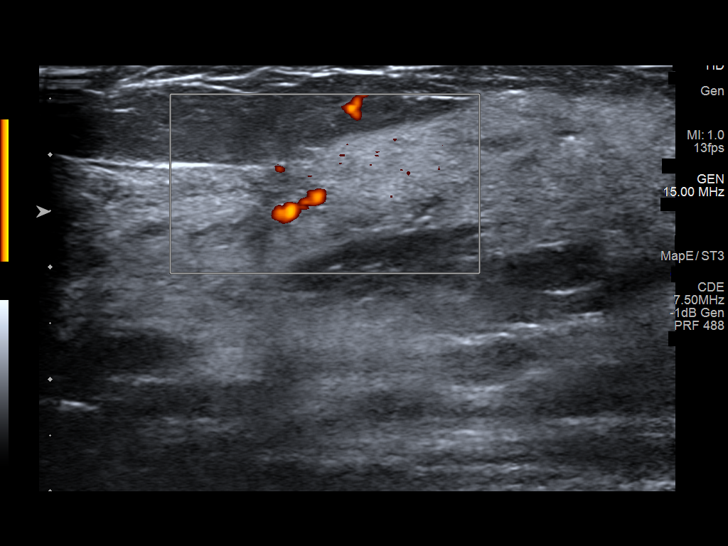
[im 9/13]
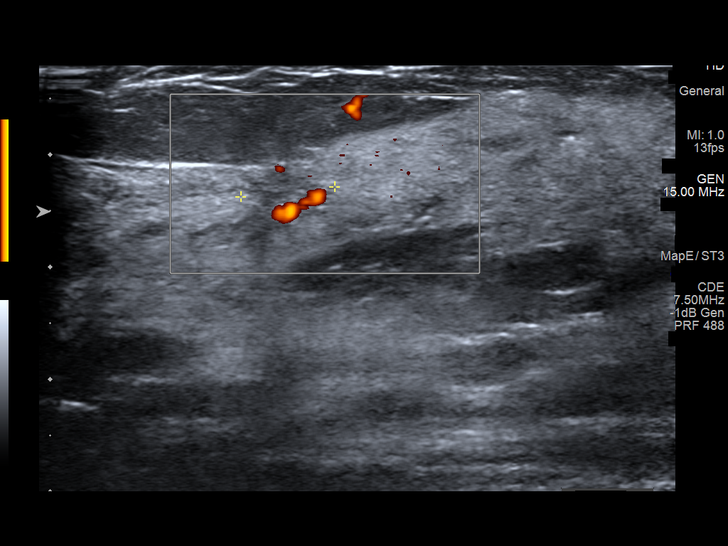
[im 10/13]
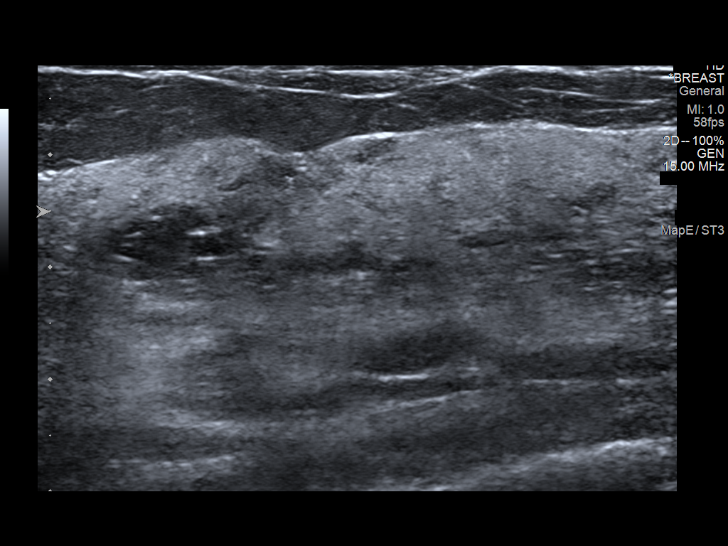
[im 11/13]
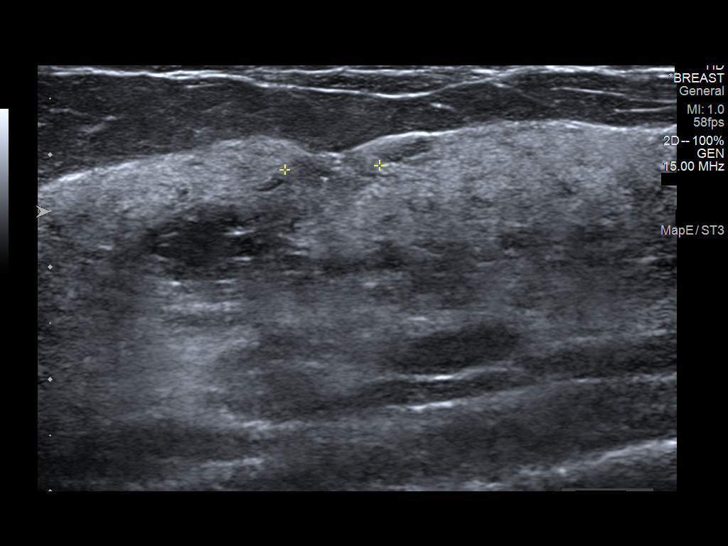
[im 12/13]
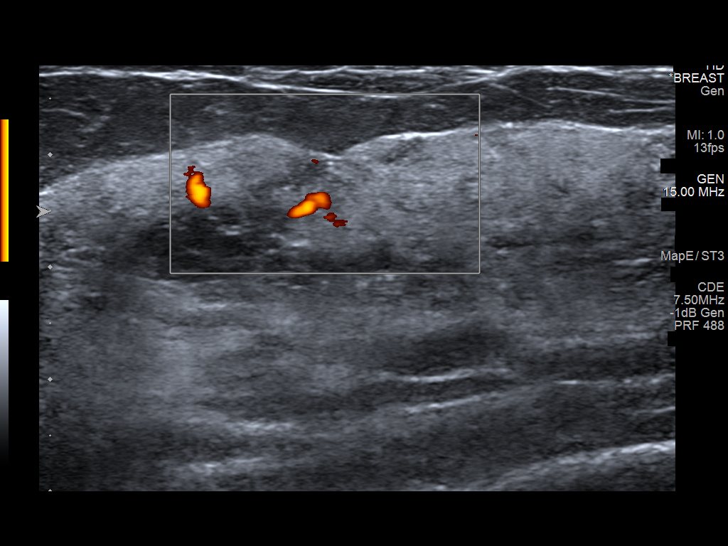
[im 13/13]
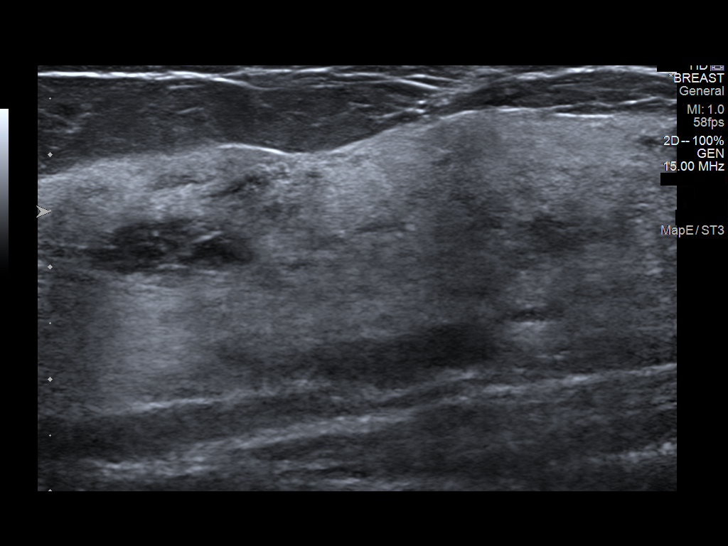

[13 of 13 positions shown; findings below may reference images not displayed]

FINDINGS: On physical exam, I palpate no discrete abnormality in the UPPER
OUTER QUADRANT of the LEFT breast. There are well-healed biopsy
sites in the LOWER OUTER QUADRANT of the LEFT breast.

Targeted ultrasound is performed, showing an oval mass with
indistinct and angular margins in the 1 o'clock location of the LEFT
breast 4 centimeters from the nipple measuring 1.0 x 0.7 x
centimeters. Internal blood flow is identified by Doppler
evaluation.

A discrete mass in the 12 o'clock location 3 centimeters from the
nipple is not identified. However, in this region, there is subtle
focal architectural distortion similar in appearance to the MRI.
Therefore, biopsy this site is also performed. This area is
estimated to measure 0.8 x 0.8 centimeters. There is internal blood
flow by Doppler evaluation.

The mass described in the posterior UPPER central portion of the
LEFT breast is not identified sonographically.
IMPRESSION: 1. Mass in the 1 o'clock location of the LEFT breast correlates well
with the MRI findings.
2. Subtle architectural distortion in the 12 o'clock location of the
LEFT breast is felt represent a likely correlate for the MRI
findings.
3. A third mass described on the MRI, measuring 5 millimeters in the
UPPER central posterior LEFT breast is not identified
sonographically.

RECOMMENDATION:
Ultrasound-guided core biopsy of masses in the 12 o'clock and 1
o'clock locations of the LEFT breast.

I have discussed the findings and recommendations with the patient.
If applicable, a reminder letter will be sent to the patient
regarding the next appointment.

BI-RADS CATEGORY  4: Suspicious.

## 2020-10-12 ENCOUNTER — Inpatient Hospital Stay: Payer: Federal, State, Local not specified - PPO | Attending: Genetic Counselor | Admitting: Hematology and Oncology

## 2020-10-12 ENCOUNTER — Inpatient Hospital Stay: Payer: Federal, State, Local not specified - PPO

## 2020-10-12 VITALS — BP 111/68 | HR 99 | Temp 97.3°F | Resp 16 | Ht 66.0 in | Wt 186.3 lb

## 2020-10-12 DIAGNOSIS — Z170 Estrogen receptor positive status [ER+]: Secondary | ICD-10-CM | POA: Diagnosis not present

## 2020-10-12 DIAGNOSIS — Z803 Family history of malignant neoplasm of breast: Secondary | ICD-10-CM | POA: Diagnosis not present

## 2020-10-12 DIAGNOSIS — C50412 Malignant neoplasm of upper-outer quadrant of left female breast: Secondary | ICD-10-CM

## 2020-10-12 DIAGNOSIS — Z7951 Long term (current) use of inhaled steroids: Secondary | ICD-10-CM | POA: Diagnosis not present

## 2020-10-12 DIAGNOSIS — Z79899 Other long term (current) drug therapy: Secondary | ICD-10-CM | POA: Diagnosis not present

## 2020-10-12 DIAGNOSIS — D0512 Intraductal carcinoma in situ of left breast: Secondary | ICD-10-CM | POA: Diagnosis present

## 2020-10-12 DIAGNOSIS — Z800 Family history of malignant neoplasm of digestive organs: Secondary | ICD-10-CM | POA: Insufficient documentation

## 2020-10-12 DIAGNOSIS — J45909 Unspecified asthma, uncomplicated: Secondary | ICD-10-CM | POA: Diagnosis not present

## 2020-10-12 NOTE — Progress Notes (Signed)
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  No orders of the defined types were placed in this encounter.  This is a very pleasant 50 yr old female with no significant PMH referred to Medical oncology initially for DCIS, now had additional biopsies showing IDC in left breast, grade 1, ER PR Strongly positive, Her 2 negative establishing with medical oncology for additional recommendations. When I first met her, I focused most of my talk on recommendations about adjuvant therapy for DCIS which includes radiation if partial mastectomy along with adjuvant endocrine therapy vs adjuvant endocrine therapy alone if she has mastectomy, unilateral With the additional biopsy information, I called her this morning and we discussed about considering oncotype after surgery and deciding about adjuvant chemotherapy vs adjuvant antiestrogen therapy based on final pathology results. We have discussed about mechanism of action of Tamoxifen, adverse effects from Tamoxifen including but not limited to post menopausal symptoms, increased risk of DVT/PE, increased risk of endometrial cancer( she has agenesis of uterus so this doesn't necessarily apply to her), increased risk of cardiovascular events and cataracts. A benefit of Tamoxifen would be improvement in bone density. She had questions about bilateral mastectomy, discussed that this would be a choice but may not be needed unless significant pathology found in right breast We also discussed life style modification for breast cancer reduction including diet, regular exercise and SBE. She will RTC to follow up after surgery.   HISTORY OF PRESENTING ILLNESS:  Autumn Roman 50 y.o. female is here  because of DCIS  Chronology  08/29/2020.  Screening mammogram showed calcification left breast that warrant further evaluation.  In the right breast, no finding suspicious for malignancy.  Diagnostic mammogram found to indeterminate groups of calcifications within the left breast demonstrated best on the true lateral view.  3/21/2022left breast biopsy showed mild fibrocystic change with focal dystrophic calcifications of the left breast, lower outer quadrant posterior depth.  This was found to be concordant by Dr. Nicki Reaper. Second biopsy revealed high-grade DCIS with necrosis and calcifications of the left breast upper outer quadrant middle depth, concordant.  MRI breast done on 10/01/2020 recommended second look ultrasound and possible biopsy larger masses in the superior left breast.  If this cannot be identified sonographically recommend MRI guided core needle biopsy for the 2 larger masses in the superior left breast. MRI guided biopsy for the non-mass enhancement in the upper outer right breast.  If left breast conservation is pursued, recommend excision of all 3 biopsied areas including the benign fibrocystic changes given that the suspicious non-mass enhancement extends through this area.  She then had an ultrasound of the left breast which showed a mass at the 1:00 location which correlates well with the MRI findings.  Subtle architectural distortion in the 12:00 location of the left breast felt represent a likely correlate with MRI findings.  A third mass described on the MRI measuring 5 mm in the upper central posterior left breast was not identified sonographically  She had biopsy of the 2 areas noted on ultrasound which showed left needle core biopsy at 12:00 showed invasive ductal carcinoma, ductal carcinoma in situ. Left needle core biopsy at 1:00 showed fibroadenoma, no malignancy identified  Prognostic markers showed 100% ER positive, 100% PR positive,  tumor cells are negative for  HER-2  We did not have the last biopsy results when we saw the patient on April 13.  She is otherwise healthy.  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  Rest of the pertinent 10 point ROS reviewed and negative.  REVIEW OF SYSTEMS:   Constitutional: Denies fevers, chills or abnormal night sweats Eyes: Denies blurriness of vision, double vision or watery eyes Ears, nose, mouth, throat, and face: Denies mucositis or sore throat Respiratory: Denies cough, dyspnea or wheezes Cardiovascular: Denies palpitation, chest discomfort or lower extremity swelling Gastrointestinal:  Denies nausea, heartburn or change in bowel habits Skin: Denies abnormal skin rashes Lymphatics: Denies new lymphadenopathy or easy bruising Neurological:Denies numbness, tingling or new weaknesses Behavioral/Psych: Mood is stable, no new changes  All other systems were reviewed with the patient and are negative.  MEDICAL HISTORY:  Past Medical History:  Diagnosis Date  . 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 Asthma   . Breast cancer ([place]) 09/22/2020  . Family history of brain cancer   . Family history of breast cancer   . Family history of esophageal cancer   . Family history of melanoma     SURGICAL HISTORY: No past surgical history on file.  SOCIAL HISTORY: Social History   Socioeconomic History  . Marital status: Married    Spouse name: Not on file  . Number of children: Not on file  . Years of education: Not on file  . Highest education level: Not on file  Occupational History  . Not on file  Tobacco Use  . Smoking status: Never Smoker  . Smokeless tobacco: Never Used  Substance and Sexual Activity  . Alcohol use: Not Currently  . Drug use: Never  . Sexual activity: Not on file    Comment: Uterine Agenesis  Other Topics Concern  . Not on file  Social History Narrative  . Not on file   Social Determinants of Health    Financial Resource Strain: Not on file  Food Insecurity: Not on file  Transportation Needs: Not on file  Physical Activity: Not on file  Stress: Not on file  Social Connections: Not on file  Intimate Partner Violence: Not At Risk  . Fear of Current or Ex-Partner: No  . Emotionally Abused: No  . Physically Abused: No  . Sexually Abused: No    FAMILY HISTORY: Family History  Problem Relation Age of Onset  . Brain cancer Mother        dx 31s  . Breast cancer Maternal Aunt        bilateral mastectomies, dx late 64s  . Melanoma Father        dx 75s  . Breast cancer Paternal Aunt   . Melanoma Paternal Uncle        dx 102s  . Esophageal cancer Paternal Grandmother        dx 37s  . Breast cancer Cousin        dx 25s or 6s (paternal first cousin)    ALLERGIES:  has No Known Allergies.  MEDICATIONS:  Current Outpatient Medications  Medication Sig Dispense Refill  . ADVAIR HFA 115-21 MCG/ACT inhaler SMARTSIG:2 Puff(s) By Mouth Twice Daily    . albuterol (VENTOLIN HFA) 108 (90 Base) MCG/ACT inhaler Inhale 2 puffs into the lungs every 6 (six) hours as needed.    Marland Kitchen ascorbic acid (VITAMIN C) 500 MG  tablet Take by mouth.    . Biotin 1000 MCG tablet Take by mouth.    . Collagen-Vitamin C-Biotin (COLLAGEN 1500/C PO) Take by mouth.    . ergocalciferol (VITAMIN D2) 1.25 MG (50000 UT) capsule Take by mouth.    . montelukast (SINGULAIR) 10 MG tablet Take 1 tablet by mouth daily.    . Multiple Vitamins-Minerals (ONCOVITE) TABS Take by mouth.    . Omeprazole Magnesium (PRILOSEC OTC PO) Take by mouth.     No current facility-administered medications for this visit.     PHYSICAL EXAMINATION: ECOG PERFORMANCE STATUS: 0 - Asymptomatic  Vitals:   10/12/20 1043  BP: 111/68  Pulse: 99  Resp: 16  Temp: (!) 97.3 F (36.3 C)  SpO2: 99%   Filed Weights   10/12/20 1043  Weight: 186 lb 4.8 oz (84.5 kg)    GENERAL:alert, no distress and comfortable SKIN: skin color, texture, turgor  are normal, no rashes or significant lesions EYES: normal, conjunctiva are pink and non-injected, sclera clear OROPHARYNX:no exudate, no erythema and lips, buccal mucosa, and tongue normal  NECK: supple, thyroid normal size, non-tender, without nodularity LYMPH:  no palpable lymphadenopathy in the cervical, axillary or inguinal LUNGS: clear to auscultation and percussion with normal breathing effort HEART: regular rate & rhythm and no murmurs and no lower extremity edema ABDOMEN:abdomen soft, non-tender and normal bowel sounds Musculoskeletal:no cyanosis of digits and no clubbing  PSYCH: alert & oriented x 3 with fluent speech NEURO: no focal motor/sensory deficits Breast: Dense area noted in the left breast upper outer quadrant without well-defined mass no palpable regional adenopathy.  Right breast appears normal to inspection and palpation  LABORATORY DATA:  I have reviewed the data as listed No results found for: WBC, HGB, HCT, MCV, PLT   Chemistry   No results found for: NA, K, CL, CO2, BUN, CREATININE, GLU No results found for: CALCIUM, ALKPHOS, AST, ALT, BILITOT     RADIOGRAPHIC STUDIES: I have personally reviewed the radiological images as listed and agreed with the findings in the report. MR BREAST BILATERAL W WO CONTRAST INC CAD  Result Date: 10/03/2020 CLINICAL DATA:  50 year old female with recently diagnosed DCIS of the left breast x2. LABS:  None. EXAM: BILATERAL BREAST MRI WITH AND WITHOUT CONTRAST TECHNIQUE: Multiplanar, multisequence MR images of both breasts were obtained prior to and following the intravenous administration of 8 ml of Gadavist Three-dimensional MR images were rendered by post-processing of the original MR data on an independent workstation. The three-dimensional MR images were interpreted, and findings are reported in the following complete MRI report for this study. Three dimensional images were evaluated at the independent interpreting workstation using  the DynaCAD thin client. COMPARISON:  None. FINDINGS: Breast composition: c. Heterogeneous fibroglandular tissue. Background parenchymal enhancement: Moderate. Right breast: In the upper-outer right breast there is ill-defined clumped non mass enhancement which is difficult to measure due to ill-defined margins but measures approximately 2.0 x 1.6 x 1.2 cm (series 8, image 45), with similar appearance to patient's known DCIS. Left breast: There is subtle susceptibility artifact associated with biopsy changes in the upper outer left breast (series 3, image 68), corresponding to the patient's X shaped clip. There is susceptibility artifact in the upper outer left breast which corresponds to the patient's coil clip (series 3, image 85). There is susceptibility artifact in the lower outer left breast (series 3, image 98), corresponding to the patient's ribbon clip. Throughout the outer aspect of the left breast there is extensive ill-defined  clumped non mass enhancement which extends from the inferior ribbon clip superiorly to and just above the X shaped clip. This measures approximately 5.8 x 2.2 x 3.6 cm and includes the biopsy site of benign fibrocystic changes. Additionally in the superior aspect of the left breast there are 3 additional indeterminate enhancing masses. One of which in the superior central breast with irregular margins is suspicious measuring approximately 0.8 x 0.7 x 0.8 cm (series 12, image 39). There is an additional heterogeneous enhancing mass in the upper outer left breast measuring approximately 1.1 x 0.9 x 0.9 cm (series 12, image 40). Another smaller enhancing mass in the posterior superior central left breast measures 0.5 cm (series 12, image 44). Lymph nodes: No abnormal appearing lymph nodes. Ancillary findings:  None. IMPRESSION: 1. Clumped non mass enhancement in the upper-outer RIGHT breast which is difficult to measure due to ill-defined margins but measures approximately 2.0 x 1.6 x  1.2 cm, similar in appearance to patient's known DCIS. 2. Extensive ill-defined clumped non mass enhancement involving the outer aspect of the LEFT breast extending from the lower outer quadrant biopsy-proven site of DCIS (ribbon clip) superiorly to and just above the biopsy proven DCIS (X clip) in the upper-outer quadrant. This area includes the third biopsy site of benign fibrocystic changes. This measures approximately 5.8 x 2.2 x 3.6 cm. 3. There are three additional indeterminate enhancing masses measuring up to 1.1 cm in the superior aspect of the left breast. 4. No lymphadenopathy. RECOMMENDATION: 1. Second look ultrasound and possible biopsy for the two larger masses in the superior left breast. If these cannot be identified sonographically recommend MRI guided core needle biopsy x 2 of the two larger masses in the superior left breast. 2. MRI guided biopsy x1 for the non mass enhancement in the upper outer right breast. 3. If breast conservation of the left breast is pursued recommend excision of all three biopsied areas, including the benign fibrocystic changes, given that the suspicious non mass enhancement extends through this area. BI-RADS CATEGORY  4: Suspicious. Electronically Signed   By: Audie Pinto M.D.   On: 10/03/2020 10:58   MM Digital Diagnostic Unilat L  Result Date: 09/15/2020 CLINICAL DATA:  Patient recalled from screening for left breast calcifications. EXAM: DIGITAL DIAGNOSTIC UNILATERAL LEFT MAMMOGRAM WITH CAD TECHNIQUE: Left digital diagnostic mammography was performed. Mammographic images were processed with CAD. COMPARISON:  Previous exam(s). ACR Breast Density Category c: The breast tissue is heterogeneously dense, which may obscure small masses. FINDINGS: Magnification cc and true lateral views of the left breast were obtained. Within the outer left breast middle depth there is a 5 mm group of coarse heterogeneous calcifications. Within the lower outer left breast middle  depth there is an additional 5 mm group of coarse heterogeneous calcifications. IMPRESSION: There are 2 indeterminate groups of calcifications within the left breast demonstrated best on the true lateral view. RECOMMENDATION: Stereotactic guided core needle biopsy of both groups of indeterminate left breast calcifications. I have discussed the findings and recommendations with the patient. If applicable, a reminder letter will be sent to the patient regarding the next appointment. BI-RADS CATEGORY  4: Suspicious. Electronically Signed   By: Lovey Newcomer M.D.   On: 09/15/2020 10:11   US BREAST LTD UNI LEFT INC AXILLA  Result Date: 10/11/2020 CLINICAL DATA:  Patient presents for evaluation of the LEFT breast. Recent stereotactic guided core biopsies show ductal carcinoma in situ of 2 sites, in the [place] and LOWER  OUTER quadrants. A third biopsy of calcifications shows fibrocystic change. However, this biopsy site is in the region of suspicious changes on MRI, prompting the recommendation for excision of the third site (marked with a coil shaped clip). On recent MRI, there are 2 masses in the UPPER portion of the LEFT breast at anterior depth. A possible third mass is also seen in the UPPER central LEFT breast, posterior depth. EXAM: ULTRASOUND OF THE LEFT BREAST COMPARISON:  MRI on 10/01/2020 and earlier studies FINDINGS: On physical exam, I palpate no discrete abnormality in the UPPER OUTER QUADRANT of the LEFT breast. There are well-healed biopsy sites in the [place] of the LEFT breast. Targeted ultrasound is performed, showing an oval mass with indistinct and angular margins in the 1 o'clock location of the LEFT breast 4 centimeters from the nipple measuring 1.0 x 0.7 x 1.0 centimeters. Internal blood flow is identified by Doppler evaluation. A discrete mass in the 12 o'clock location 3 centimeters from the nipple is not identified. However, in this region, there is subtle focal  architectural distortion similar in appearance to the MRI. Therefore, biopsy this site is also performed. This area is estimated to measure 0.8 x 0.8 centimeters. There is internal blood flow by Doppler evaluation. The mass described in the posterior UPPER central portion of the LEFT breast is not identified sonographically. IMPRESSION: 1. Mass in the 1 o'clock location of the LEFT breast correlates well with the MRI findings. 2. Subtle architectural distortion in the 12 o'clock location of the LEFT breast is felt represent a likely correlate for the MRI findings. 3. A third mass described on the MRI, measuring 5 millimeters in the UPPER central posterior LEFT breast is not identified sonographically. RECOMMENDATION: Ultrasound-guided core biopsy of masses in the 12 o'clock and 1 o'clock locations of the LEFT breast. I have discussed the findings and recommendations with the patient. If applicable, a reminder letter will be sent to the patient regarding the next appointment. BI-RADS CATEGORY  4: Suspicious. Electronically Signed   By: Nolon Nations M.D.   On: 10/11/2020 10:31   MM CLIP PLACEMENT LEFT  Result Date: 10/11/2020 CLINICAL DATA:  Status post ultrasound-guided core biopsy of 2 masses in the LEFT breast, in the 12 o'clock and 1 o'clock locations. Patient has had 3 recent stereotactic biopsies of the LEFT breast. EXAM: DIAGNOSTIC LEFT MAMMOGRAM POST ULTRASOUND BIOPSY x2 COMPARISON:  Previous exam(s). FINDINGS: Mammographic images were obtained following ultrasound guided biopsy of mass in the 12 o'clock location of the LEFT breast and placement of a acute shaped clip. The biopsy marking clip is in expected position at the site of biopsy. Following biopsy of mass in the 1 o'clock location of the LEFT breast, a heart shaped clip was placed and is identified within the expected location. IMPRESSION: Tissue marker clips are in the expected locations after biopsy. Final Assessment: Post Procedure  Mammograms for Marker Placement Electronically Signed   By: Nolon Nations M.D.   On: 10/11/2020 10:56   MM CLIP PLACEMENT LEFT  Addendum Date: 09/23/2020   ADDENDUM REPORT: 09/23/2020 08:26 ADDENDUM: After reviewing the images, I believe that the coil shaped tissue marker clip did not migrate and a third group calcifications in the LOWER OUTER QUADRANT were sampled. On the CC view, this third group of calcifications overlies the larger group of calcifications that are more inferiorly located. The patient is scheduled to return for biopsy of the larger group of calcifications in the [place] that  are more inferiorly located. Electronically Signed   By: Evangeline Dakin M.D.   On: 09/23/2020 08:26   Result Date: 09/23/2020 CLINICAL DATA:  Confirmation of clip placement after stereotactic tomosynthesis core needle biopsy of 2 groups of indeterminate calcifications involving the LEFT breast. EXAM: 2D AND TOMOSYNTHESIS DIAGNOSTIC LEFT MAMMOGRAM POST STEREOTACTIC BIOPSY COMPARISON:  Previous exam(s). FINDINGS: Tomosynthesis and synthesized full field CC and mediolateral images were obtained following stereotactic tomosynthesis guided biopsy of 2 groups of calcifications involving the LEFT breast. The coil shaped biopsy marking clip migrated approximately 2 cm SUPERIOR to the calcifications in the LOWER OUTER QUADRANT at POSTERIOR depth, the calcifications were present in the core samples. There are residual calcifications in the LOWER OUTER QUADRANT at POSTERIOR depth. The X shaped tissue marking clip is appropriately position at the site of the biopsied calcifications in the UPPER OUTER quadrant at MIDDLE depth. The clips are separated by approximately 2.4 cm. The residual calcifications in the LOWER OUTER QUADRANT at POSTERIOR depth are approximately 4 cm from the X shaped clip. Expected post biopsy changes are present at both sites without evidence of hematoma. IMPRESSION: 1. Approximate 2 cm  SUPERIOR migration of the coil shaped tissue marker clip relative to the calcifications in the LOWER OUTER QUADRANT at POSTERIOR depth. There are residual calcifications at the site. 2. Appropriate positioning of the X shaped tissue marker clip at the site of the biopsied calcifications in the UPPER OUTER QUADRANT MIDDLE depth. 3. The clips are separated by approximately 2.4 cm. The residual calcifications in the LOWER OUTER QUADRANT are approximately 4 cm from the X shaped clip. Final Assessment: Post Procedure Mammograms for Marker Placement Electronically Signed: By: Evangeline Dakin M.D. On: 09/19/2020 08:56   MM CLIP PLACEMENT LEFT  Result Date: 09/22/2020 CLINICAL DATA:  Evaluate post biopsy marker clip placement following stereotactic core needle biopsy of left breast calcifications. EXAM: DIAGNOSTIC LEFT MAMMOGRAM POST STEREOTACTIC BIOPSY COMPARISON:  Previous exam(s). FINDINGS: Mammographic images were obtained following stereotactic guided biopsy of left breast calcifications. The biopsy marking clip is in expected position at the site of biopsy. IMPRESSION: Appropriate positioning of the ribbon shaped biopsy marking clip at the site of biopsy in the in the lower outer quadrant of the left breast, in the expected location of the calcifications. No residual calcifications are visualized. Final Assessment: Post Procedure Mammograms for Marker Placement Electronically Signed   By: Lajean Manes M.D.   On: 09/22/2020 08:36   MM LT BREAST BX W LOC DEV 1ST LESION IMAGE BX SPEC STEREO GUIDE  Addendum Date: 09/26/2020   ADDENDUM REPORT: 09/26/2020 09:54 ADDENDUM: Pathology revealed DUCTAL CARCINOMA IN SITU WITH CALCIFICATIONS AND NECROSIS of the Left breast, lower outer quadrant. This was found to be concordant by Dr. Lajean Manes. Pathology results were discussed with the patient by telephone. The patient reported doing well after the biopsy with tenderness at the site. Post biopsy instructions and care  were reviewed and questions were answered. The patient was encouraged to call [facility] for any additional concerns. My direct phone number was provided. The patient has a recent diagnosis of Left breast cancer and is scheduled to see Dr. Autumn Messing at [facility] on September 27, 2020. Pathology results reported by Terie Purser, RN on 09/26/2020. Electronically Signed   By: Lajean Manes M.D.   On: 09/26/2020 09:54   Result Date: 09/26/2020 CLINICAL DATA:  Patient presents for stereotactic core needle biopsy of a third group of left  breast calcifications. Patient underwent stereotactic biopsy of 2 other groups of calcifications on 09/19/2020, 1 group yielding high-grade DCIS, localized with an X shaped biopsy clip, the other group fibrocystic changes without evidence of atypia or malignancy, coil shaped biopsy clip. EXAM: LEFT BREAST STEREOTACTIC CORE NEEDLE BIOPSY COMPARISON:  Previous exams. FINDINGS: The patient and I discussed the procedure of stereotactic-guided biopsy including benefits and alternatives. We discussed the high likelihood of a successful procedure. We discussed the risks of the procedure including infection, bleeding, tissue injury, clip migration, and inadequate sampling. Informed written consent was given. The usual time out protocol was performed immediately prior to the procedure. Using sterile technique and 1% Lidocaine as local anesthetic, under stereotactic guidance, a 9 gauge vacuum assisted device was used to perform core needle biopsy of calcifications in the lower outer quadrant of the left breast using a lateral approach. Specimen radiograph was performed showing multiple calcifications for which biopsy was performed. Specimens with calcifications are identified for pathology. Lesion quadrant: Lower outer quadrant At the conclusion of the procedure, ribbon shaped tissue marker clip was deployed into the biopsy cavity. Follow-up 2-view mammogram  was performed and dictated separately. IMPRESSION: Stereotactic-guided biopsy of left breast calcifications. No apparent complications. Electronically Signed: By: Lajean Manes M.D. On: 09/22/2020 08:26   MM LT BREAST BX W LOC DEV 1ST LESION IMAGE BX SPEC STEREO GUIDE  Addendum Date: 09/20/2020   ADDENDUM REPORT: 09/20/2020 14:11 ADDENDUM: Pathology revealed MILD FIBROCYSTIC CHANGE WITH FOCAL DYSTROPHIC CALCIFICATIONS of the Left breast, lower outer quadrant, posterior depth. This was found to be concordant by Dr. Peggye Fothergill. 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 This was found to be concordant by Dr. Peggye Fothergill. Pathology results were discussed with the patient by telephone. The patient reported doing well after the biopsies with tenderness at the sites. Post biopsy instructions and care were reviewed and questions were answered. The patient was encouraged to call [facility] [place] for any additional concerns. My direct phone number was provided. Surgical consultation has been arranged with Dr. Autumn Messing at [facility] on September 27, 2020. The patient is scheduled for a Left breast stereotactic guided biopsy for a third group of calcifications in the lower outer quadrant on September 22, 2020. Further recommendations will be guided by the results of this biopsy. One might consider a bilateral breast MRI for further evaluation of extent of disease given the high grade histology. Pathology results reported by Terie Purser, RN on 09/20/2020. Electronically Signed   By: Evangeline Dakin M.D.   On: 09/20/2020 14:11   Result Date: 09/20/2020 CLINICAL DATA:  50 year old with 2 groups of screening detected indeterminate calcifications involving the LEFT breast, each group measuring approximately 5 mm. One of the groups is located in the [place] at POSTERIOR depth  and the other group is located in the [place] at MIDDLE depth. EXAM: LEFT BREAST STEREOTACTIC CORE NEEDLE BIOPSY x 2 COMPARISON:  Previous exams. FINDINGS: The patient and I discussed the procedure of stereotactic-guided biopsy including benefits and alternatives. We discussed the high likelihood of a successful procedure. We discussed the risks of the procedure including infection, bleeding, tissue injury, clip migration, and inadequate sampling. Informed written consent was given. The usual time out protocol was performed immediately prior to the procedure. # 1) Lesion quadrant: LOWER OUTER QUADRANT. Using sterile technique with chlorhexidine as skin antisepsis, 1% lidocaine  and 1% lidocaine with epinephrine as local anesthetic, under stereotactic guidance, a 9 gauge Brevera vacuum assisted device was used to perform core needle biopsy of the calcifications in the LOWER OUTER QUADRANT at POSTERIOR depth using a lateral approach. Specimen radiograph was performed showing calcifications in at least 2 of the core samples. Specimens with calcifications are identified for pathology. At the conclusion of the procedure, a coil shaped tissue marker clip was deployed into the biopsy cavity. # 2) Lesion quadrant: UPPER OUTER QUADRANT. Using sterile technique with chlorhexidine as skin antisepsis, 1% lidocaine and 1% lidocaine with epinephrine as local anesthetic, under stereotactic guidance, a 9 gauge Brevera vacuum assisted device was used to perform core needle biopsy of the calcifications in the UPPER OUTER QUADRANT MIDDLE depth using a lateral approach. Specimen radiograph was performed showing calcifications in at least 1 of the core samples. Specimens with calcifications are identified for pathology. At the conclusion of the procedure, an X shaped tissue marker clip was deployed into the biopsy cavity. Follow-up 2-view mammogram was performed and dictated separately. IMPRESSION: Stereotactic-guided biopsy  of 2 groups of indeterminate calcifications involving the LEFT breast. No apparent complications. Electronically Signed: By: Evangeline Dakin M.D. On: 09/19/2020 08:57   MM LT BREAST BX W LOC DEV EA AD LESION IMG BX SPEC STEREO GUIDE  Addendum Date: 09/20/2020   ADDENDUM REPORT: 09/20/2020 14:11 ADDENDUM: Pathology revealed MILD FIBROCYSTIC CHANGE WITH FOCAL DYSTROPHIC CALCIFICATIONS of the Left breast, lower outer quadrant, posterior depth. This was found to be concordant by Dr. Peggye Fothergill. 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 This was found to be concordant by Dr. Peggye Fothergill. Pathology results were discussed with the patient by telephone. The patient reported doing well after the biopsies with tenderness at the sites. Post biopsy instructions and care were reviewed and questions were answered. The patient was encouraged to call [facility] for any additional concerns. My direct phone number was provided. Surgical consultation has been arranged with Dr. Autumn Messing at [facility] on September 27, 2020. The patient is scheduled for a Left breast stereotactic guided biopsy for a third group of calcifications in the lower outer quadrant on September 22, 2020. Further recommendations will be guided by the results of this biopsy. One might consider a bilateral breast MRI for further evaluation of extent of disease given the high grade histology. Pathology results reported by Terie Purser, RN on 09/20/2020. Electronically Signed   By: Evangeline Dakin M.D.   On: 09/20/2020 14:11   Result Date: 09/20/2020 CLINICAL DATA:  50 year old with 2 groups of screening detected indeterminate calcifications involving the LEFT breast, each group measuring approximately 5 mm. One of the groups is located in the [place] at POSTERIOR depth and the other group is located in  the [place] at MIDDLE depth. EXAM: LEFT BREAST STEREOTACTIC CORE NEEDLE BIOPSY x 2 COMPARISON:  Previous exams. FINDINGS: The patient and I discussed the procedure of stereotactic-guided biopsy including benefits and alternatives. We discussed the high likelihood of a successful procedure. We discussed the risks of the procedure including infection, bleeding, tissue injury, clip migration, and inadequate sampling. Informed written consent was given. The usual time out protocol was performed immediately prior to the procedure. # 1) Lesion quadrant: LOWER OUTER QUADRANT. Using sterile technique with chlorhexidine as skin antisepsis, 1% lidocaine and 1% lidocaine with epinephrine as local anesthetic, under stereotactic  guidance, a 9 gauge Brevera vacuum assisted device was used to perform core needle biopsy of the calcifications in the LOWER OUTER QUADRANT at POSTERIOR depth using a lateral approach. Specimen radiograph was performed showing calcifications in at least 2 of the core samples. Specimens with calcifications are identified for pathology. At the conclusion of the procedure, a coil shaped tissue marker clip was deployed into the biopsy cavity. # 2) Lesion quadrant: UPPER OUTER QUADRANT. Using sterile technique with chlorhexidine as skin antisepsis, 1% lidocaine and 1% lidocaine with epinephrine as local anesthetic, under stereotactic guidance, a 9 gauge Brevera vacuum assisted device was used to perform core needle biopsy of the calcifications in the UPPER OUTER QUADRANT MIDDLE depth using a lateral approach. Specimen radiograph was performed showing calcifications in at least 1 of the core samples. Specimens with calcifications are identified for pathology. At the conclusion of the procedure, an X shaped tissue marker clip was deployed into the biopsy cavity. Follow-up 2-view mammogram was performed and dictated separately. IMPRESSION: Stereotactic-guided biopsy of 2 groups of indeterminate  calcifications involving the LEFT breast. No apparent complications. Electronically Signed: By: Evangeline Dakin M.D. On: 09/19/2020 08:57   [place] LT BREAST BX W LOC DEV 1ST LESION IMG BX SPEC US GUIDE  Result Date: 10/11/2020 CLINICAL DATA:  Two suspicious masses in the LEFT breast seen on recent MRI and ultrasound. Recent diagnosis of ductal carcinoma in situ in the LEFT breast. EXAM: ULTRASOUND GUIDED LEFT BREAST CORE NEEDLE BIOPSY COMPARISON:  Previous exam(s). PROCEDURE: I met with the patient and we discussed the procedure of ultrasound-guided biopsy, including benefits and alternatives. We discussed the high likelihood of a successful procedure. We discussed the risks of the procedure, including infection, bleeding, tissue injury, clip migration, and inadequate sampling. Informed written consent was given. The usual time-out protocol was performed immediately prior to the procedure. Site 1: 12 o'clock location LEFT breast. Lesion quadrant: UPPER central LEFT breast, Q clip Using sterile technique and 1% lidocaine and 1% lidocaine with epinephrine as local anesthetic, under direct ultrasound visualization, a 12 gauge coaxial spring-loaded device was used to perform biopsy of subtle distortion in the 12 o'clock location of the LEFT breast using a LATERAL to MEDIAL approach. At the conclusion of the procedure Q tissue marker clip was deployed into the biopsy cavity. Site 2: 1 o'clock location LEFT breast. Lesion quadrant: UPPER-OUTER QUADRANT LEFT breast, heart shaped clip Using sterile technique and 1% lidocaine and 1% lidocaine with epinephrine as local anesthetic, under direct ultrasound visualization, a 12 gauge coaxial spring-loaded device was used to perform biopsy of mass in the 1 o'clock location of the LEFT breast using a LATERAL to MEDIAL approach. At the conclusion of the procedure heart shaped tissue marker clip was deployed into the biopsy cavity. Follow-up 2-view mammogram was performed and  dictated separately. IMPRESSION: Ultrasound guided biopsy of 2 masses in the LEFT breast. No apparent complications. Electronically Signed   By: Nolon Nations M.D.   On: 10/11/2020 10:33   [place] LT BREAST BX W LOC DEV EA ADD LESION IMG BX SPEC US GUIDE  Result Date: 10/11/2020 CLINICAL DATA:  Two suspicious masses in the LEFT breast seen on recent MRI and ultrasound. Recent diagnosis of ductal carcinoma in situ in the LEFT breast. EXAM: ULTRASOUND GUIDED LEFT BREAST CORE NEEDLE BIOPSY COMPARISON:  Previous exam(s). PROCEDURE: I met with the patient and we discussed the procedure of ultrasound-guided biopsy, including benefits and alternatives. We discussed the high likelihood of a successful procedure. We  discussed the risks of the procedure, including infection, bleeding, tissue injury, clip migration, and inadequate sampling. Informed written consent was given. The usual time-out protocol was performed immediately prior to the procedure. Site 1: 12 o'clock location LEFT breast. Lesion quadrant: UPPER central LEFT breast, Q clip Using sterile technique and 1% lidocaine and 1% lidocaine with epinephrine as local anesthetic, under direct ultrasound visualization, a 12 gauge coaxial spring-loaded device was used to perform biopsy of subtle distortion in the 12 o'clock location of the LEFT breast using a LATERAL to MEDIAL approach. At the conclusion of the procedure Q tissue marker clip was deployed into the biopsy cavity. Site 2: 1 o'clock location LEFT breast. Lesion quadrant: UPPER-OUTER QUADRANT LEFT breast, heart shaped clip Using sterile technique and 1% lidocaine and 1% lidocaine with epinephrine as local anesthetic, under direct ultrasound visualization, a 12 gauge coaxial spring-loaded device was used to perform biopsy of mass in the 1 o'clock location of the LEFT breast using a LATERAL to MEDIAL approach. At the conclusion of the procedure heart shaped tissue marker clip was deployed into the biopsy  cavity. Follow-up 2-view mammogram was performed and dictated separately. IMPRESSION: Ultrasound guided biopsy of 2 masses in the LEFT breast. No apparent complications. Electronically Signed   By: Nolon Nations M.D.   On: 10/11/2020 10:33    All questions were answered. The patient knows to call the clinic with any problems, questions or concerns. I spent 60 minutes in the care of this patient including H and P, review of records, counseling and coordination of care.     Benay Pike, MD 10/12/2020 11:20 AM

## 2020-10-14 ENCOUNTER — Telehealth: Payer: Self-pay | Admitting: Genetic Counselor

## 2020-10-14 ENCOUNTER — Other Ambulatory Visit: Payer: Self-pay | Admitting: Genetic Counselor

## 2020-10-14 ENCOUNTER — Encounter: Payer: Self-pay | Admitting: Hematology and Oncology

## 2020-10-14 DIAGNOSIS — Z1379 Encounter for other screening for genetic and chromosomal anomalies: Secondary | ICD-10-CM

## 2020-10-14 NOTE — Telephone Encounter (Signed)
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 The laboratory has offered to perform additional RNA testing on Ms. Barillas in order to learn more about this variant. We discussed that this additional testing is unlikely to result in a reclassification for this variant at this time, but could provide evidence to help with reclassification in the future.   Ms. Mehlman is interested in proceeding with additional testing. Scheduled a blood draw on 11/23/20 at 9:45 am, after her appointment with Dr. Chryl Heck. We will call her with the results from this testing once they are available, typically two-three weeks after the sample is received by [facility].

## 2020-10-17 ENCOUNTER — Other Ambulatory Visit: Payer: Self-pay | Admitting: General Surgery

## 2020-10-17 DIAGNOSIS — R9389 Abnormal findings on diagnostic imaging of other specified body structures: Secondary | ICD-10-CM

## 2020-10-20 ENCOUNTER — Telehealth: Payer: Self-pay | Admitting: *Deleted

## 2020-10-20 ENCOUNTER — Encounter: Payer: Self-pay | Admitting: *Deleted

## 2020-10-20 NOTE — Telephone Encounter (Signed)
Called pt, left vm with navigation resources and contact information. Request return call with questions or needs

## 2020-10-24 ENCOUNTER — Other Ambulatory Visit: Payer: Self-pay

## 2020-10-24 ENCOUNTER — Ambulatory Visit
Admission: RE | Admit: 2020-10-24 | Discharge: 2020-10-24 | Disposition: A | Discharge status: Home / Self Care | Payer: Federal, State, Local not specified - PPO | Source: Ambulatory Visit | Attending: General Surgery | Admitting: General Surgery

## 2020-10-24 ENCOUNTER — Other Ambulatory Visit: Payer: Self-pay | Admitting: General Practice

## 2020-10-24 DIAGNOSIS — R9389 Abnormal findings on diagnostic imaging of other specified body structures: Secondary | ICD-10-CM

## 2020-10-24 IMAGING — MG MM BREAST LOCALIZATION CLIP
4 series · 4 of 12 positions shown · non-contrast
Comparison: Previous exam(s).

CLINICAL DATA: Post procedure mammogram for clip placement.

EXAM:
DIAGNOSTIC RIGHT MAMMOGRAM POST MRI BIOPSY

[R CC synth-2D]
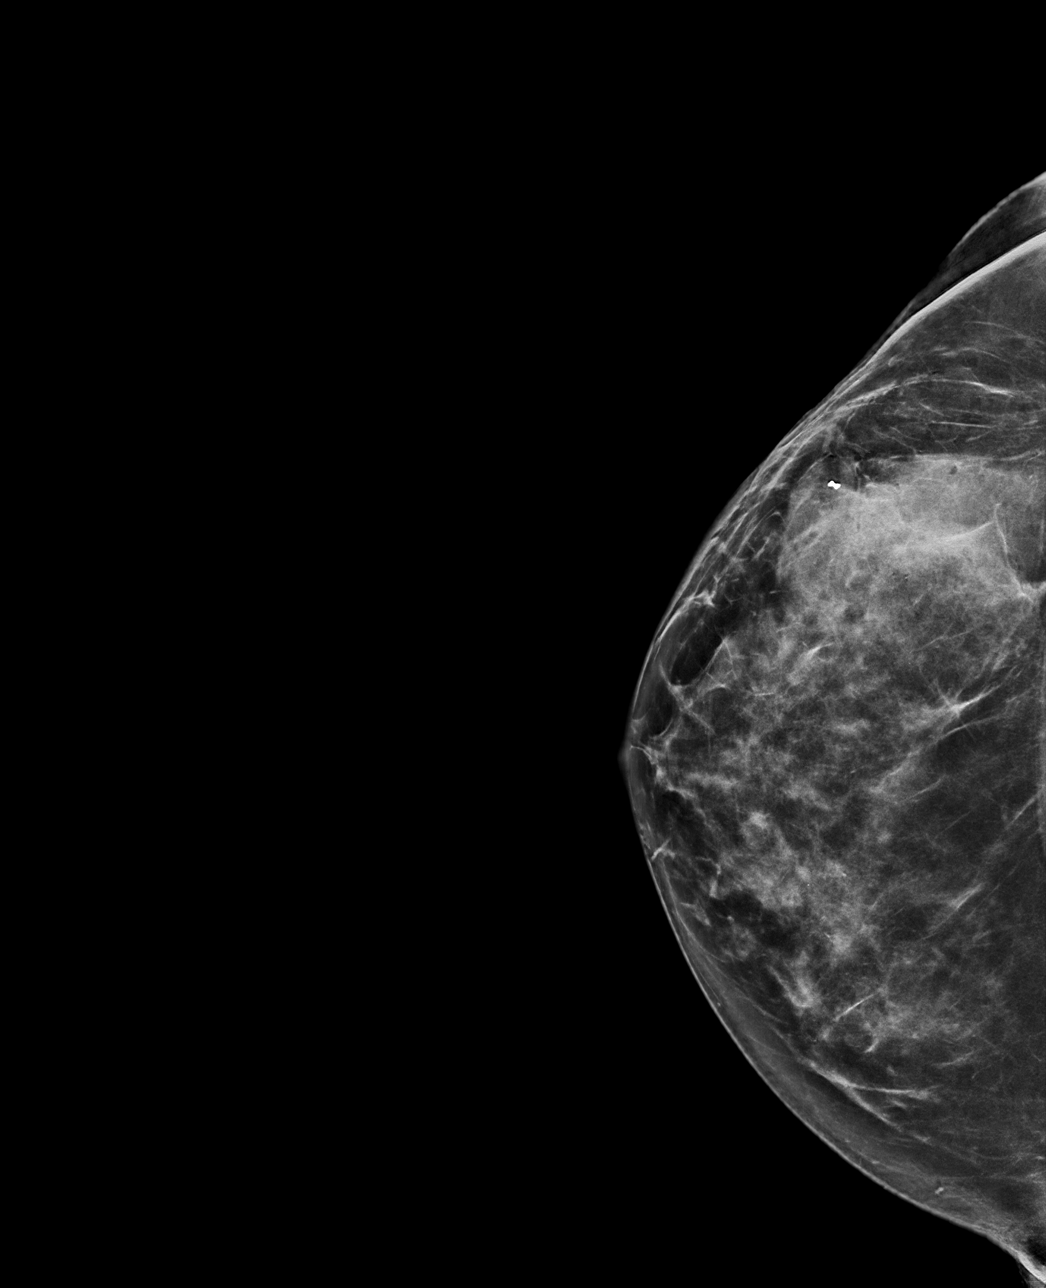

[R ML synth-2D]
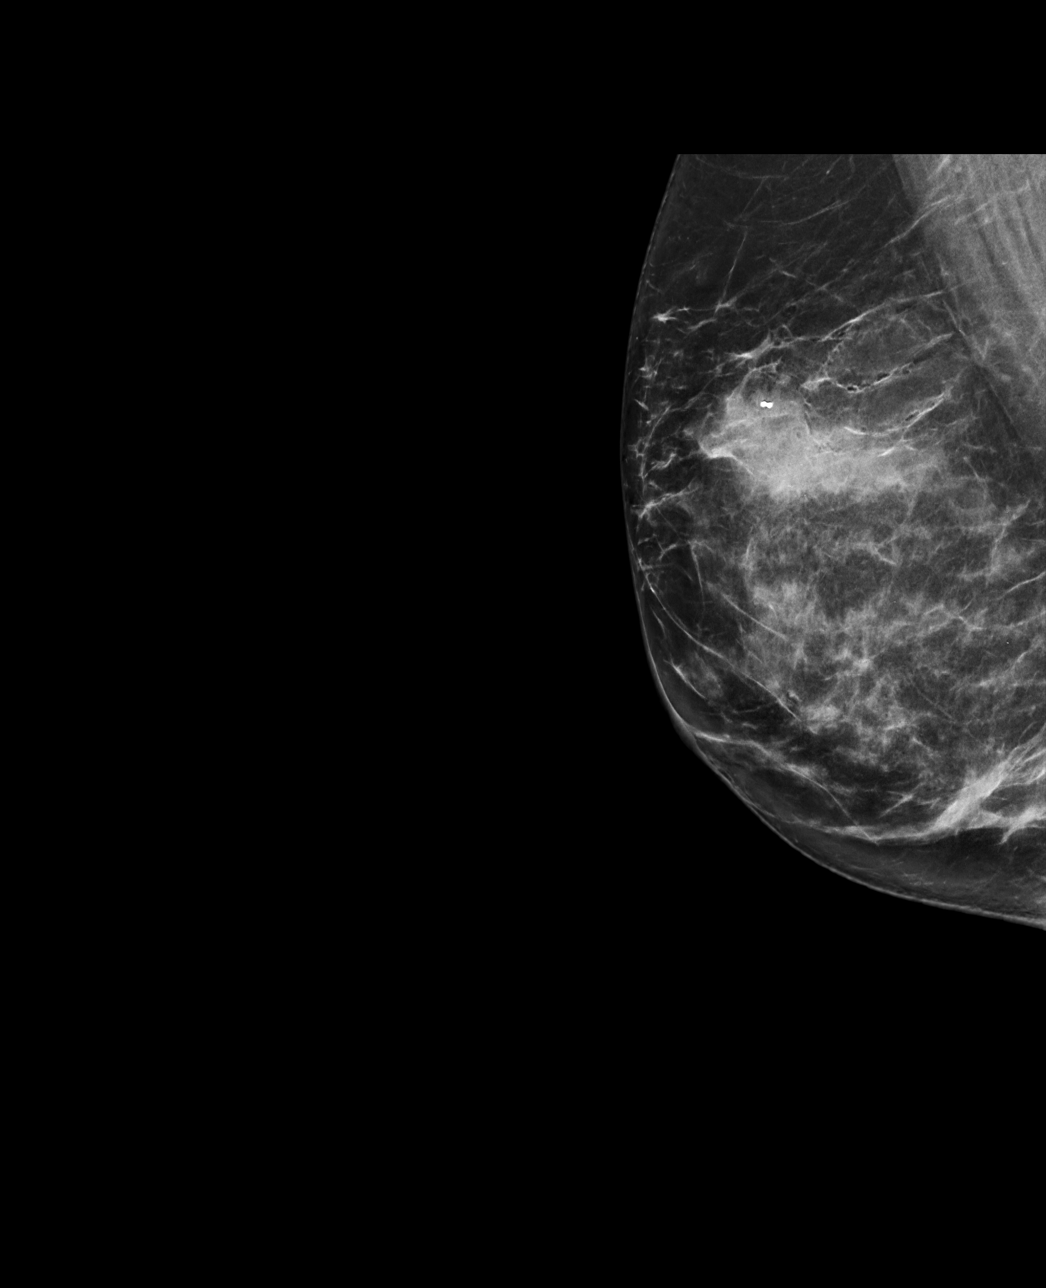

[R ML tomo · tomo slice 44/87.0]
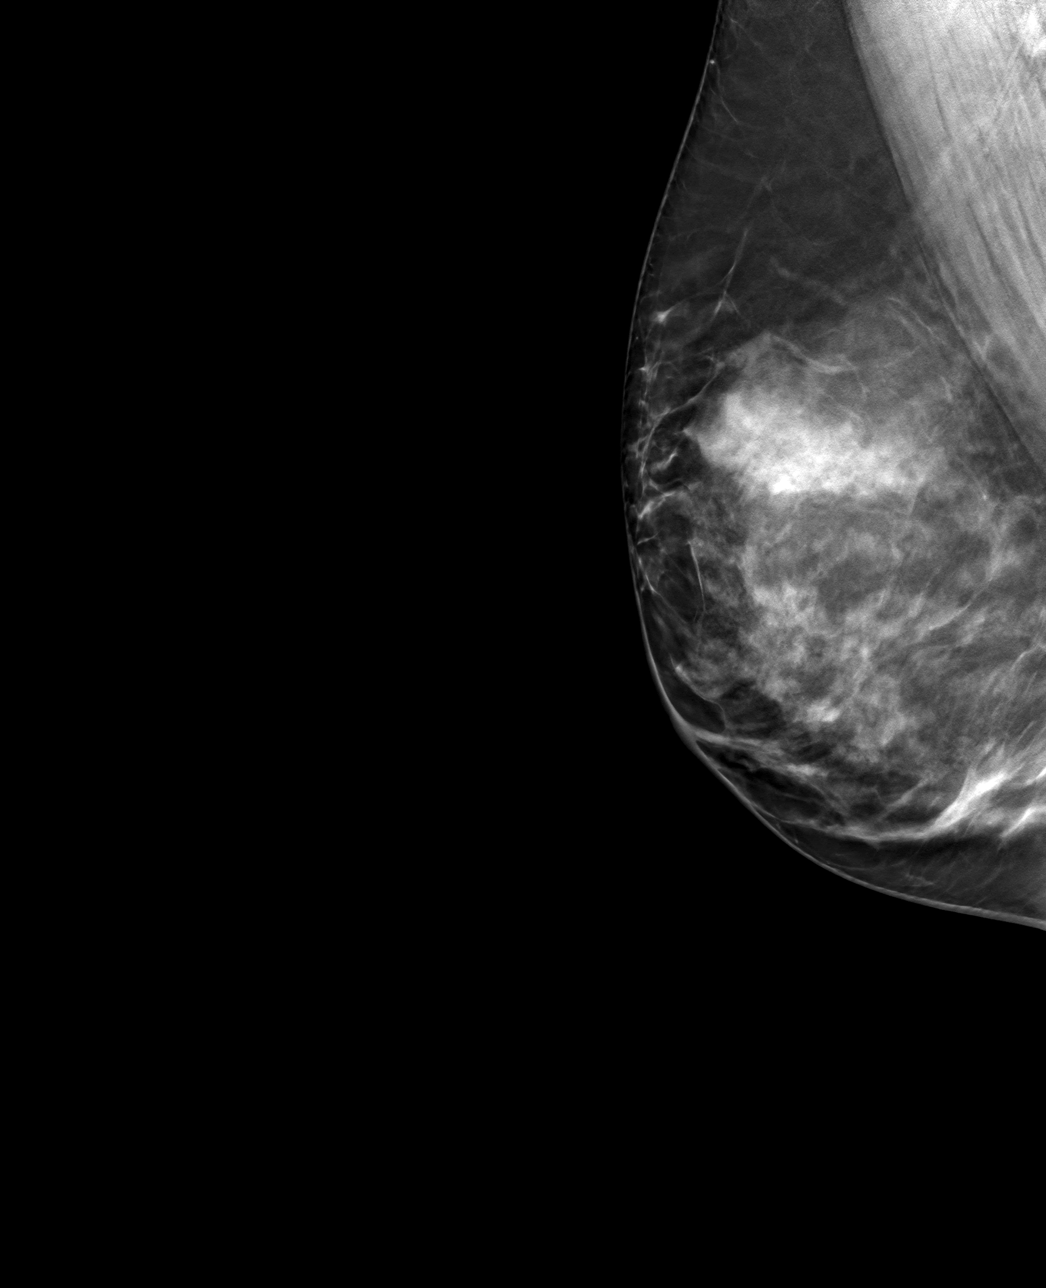

[R CC tomo · tomo slice 43/85.0]
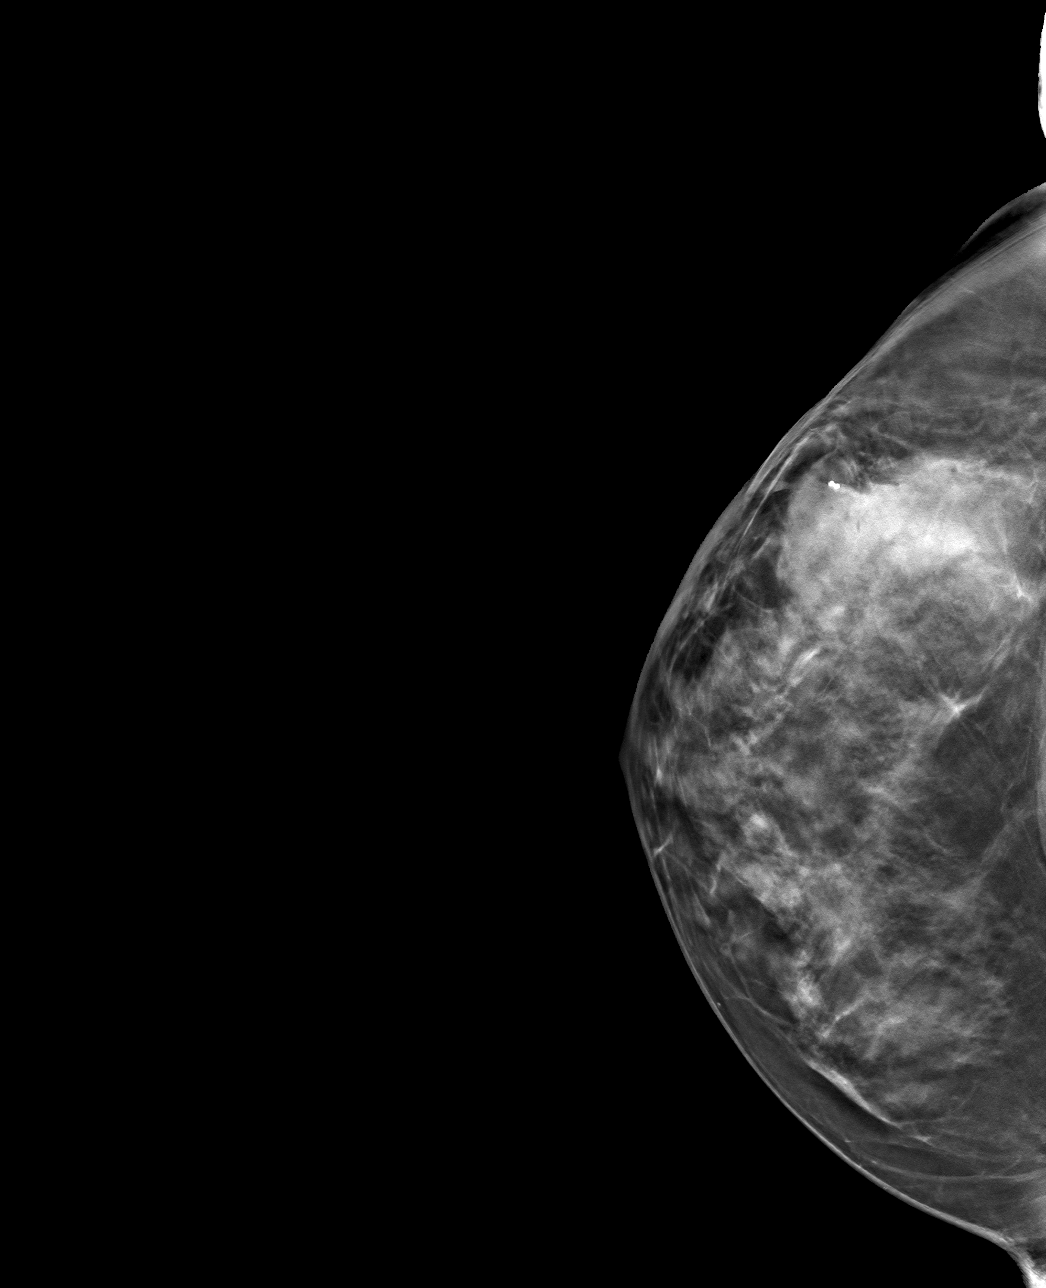

[4 of 12 positions shown; findings below may reference images not displayed]

FINDINGS: Mammographic images were obtained following MRI guided biopsy of non
mass enhancement in the upper outer right breast. The biopsy marking
clip is in expected position at the site of biopsy.
IMPRESSION: Appropriate positioning of the barbell shaped biopsy marking clip at
the site of biopsy in the upper outer right breast.

Final Assessment: Post Procedure Mammograms for Marker Placement

## 2020-10-24 IMAGING — MR MR BREAST BX W/ LOC DEV 1ST LEASION IMAGE BX SPEC MR GUIDE*R*
7 of 10 series · 33 of 48 positions shown · IV contrast (gadavist)
Comparison: Previous exams.
COMPARISON: Previous exams.

Addendum:
CLINICAL DATA: 49-year-old female presenting for biopsy of non mass
enhancement in the right breast. Patient was recently diagnosed with
invasive and in situ cancer of the left breast.

EXAM:
MRI GUIDED CORE NEEDLE BIOPSY OF THE RIGHT BREAST
TECHNIQUE: Multiplanar, multisequence MR imaging of the right breast was
performed both before and after administration of intravenous
contrast.
CONTRAST:  9mL GADAVIST GADOBUTROL 1 MMOL/ML IV SOLN

[Series 2: fiducial unilateral · sagittal · 2.0mm · 1.33mm/px · 3 of 52 slices shown]
[im 1/52]
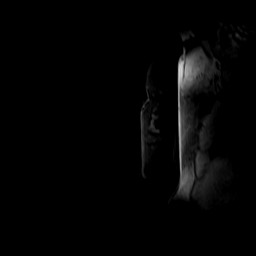
[im 26/52]
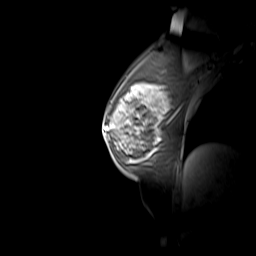
[im 52/52]
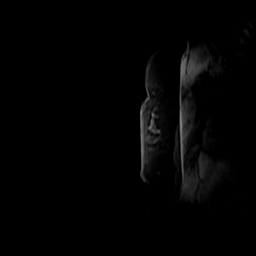

[Series 3: dynamic pre · axial · non-contrast · 1.3mm · 0.73mm/px · z∈[-81,+126]mm · 5 of 160 slices shown]
[im 1/160]
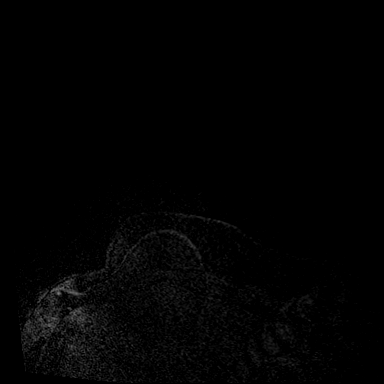
[im 40/160]
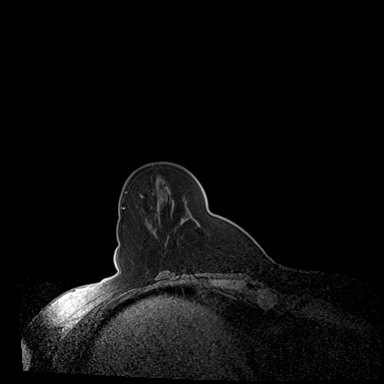
[im 80/160]
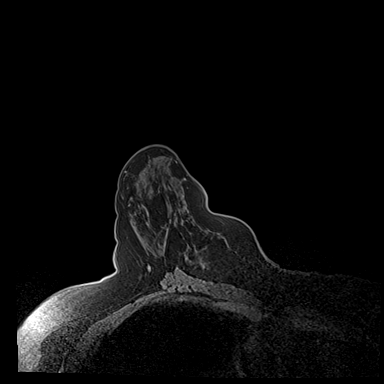
[im 120/160]
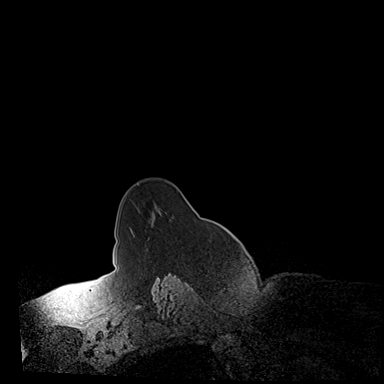
[im 160/160]
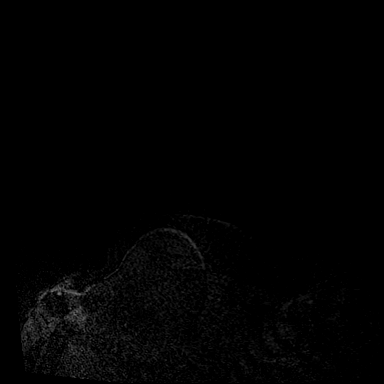

[Series 4: dynamic post 20 · axial · 1.3mm · 0.73mm/px · z∈[-81,+126]mm · 5 of 160 slices shown (1 of 2)]
[im 1/160]
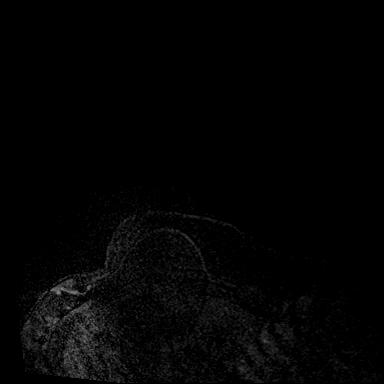
[im 40/160]
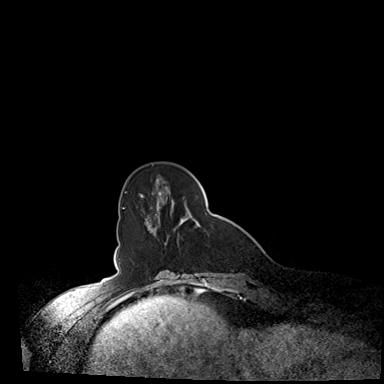
[im 80/160]
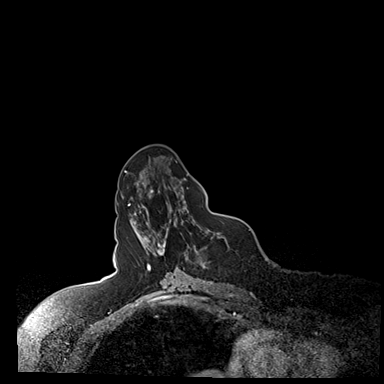
[im 120/160]
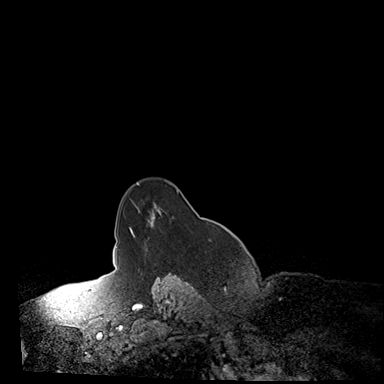
[im 160/160]
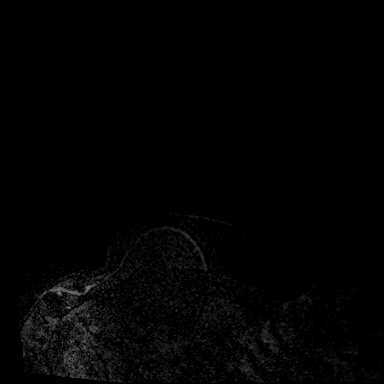

[Series 5: dynamic post 20 · axial · 1.3mm · 0.73mm/px · z∈[-81,+126]mm · 5 of 160 slices shown (2 of 2)]
[im 1/160]
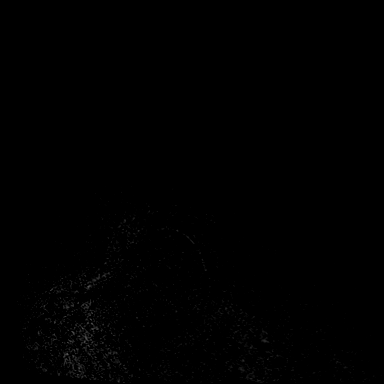
[im 40/160]
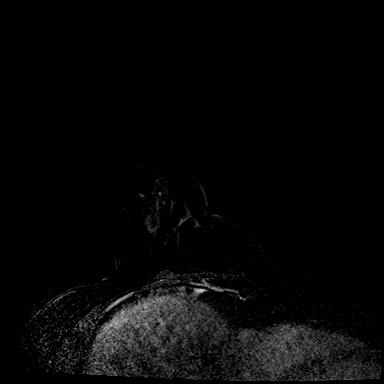
[im 80/160]
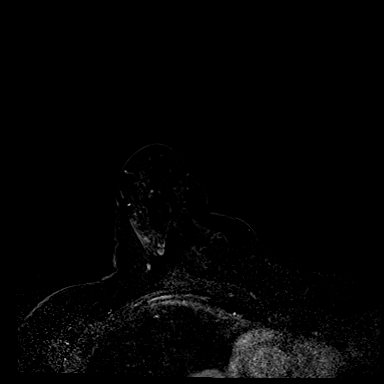
[im 120/160]
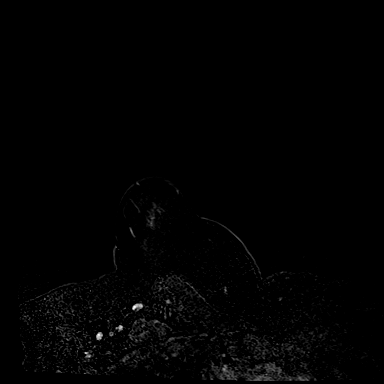
[im 160/160]
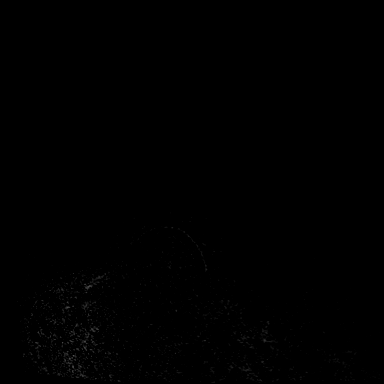

[Series 6: dynamic post 3 · axial · 1.3mm · 0.73mm/px · z∈[-81,+126]mm · 5 of 160 slices shown (1 of 2)]
[im 1/160]
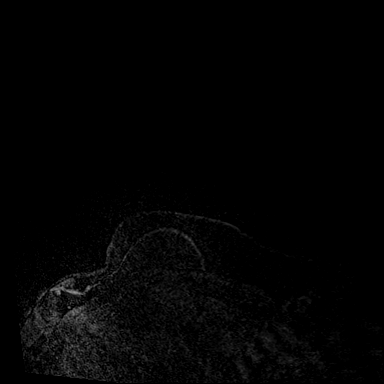
[im 40/160]
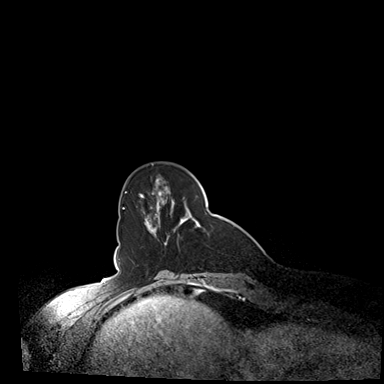
[im 80/160]
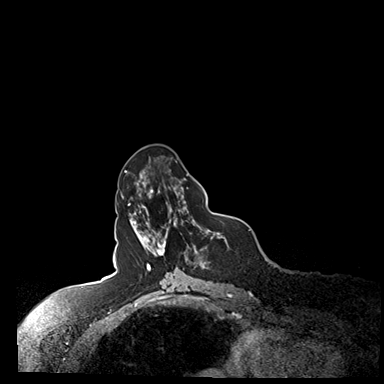
[im 120/160]
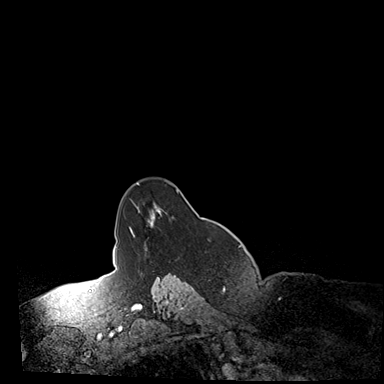
[im 160/160]
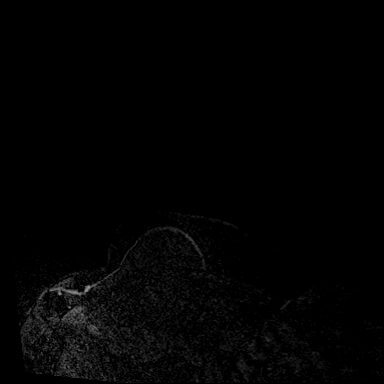

[Series 7: dynamic post 3 · axial · 1.3mm · 0.73mm/px · z∈[-81,+126]mm · 5 of 160 slices shown (2 of 2)]
[im 1/160]
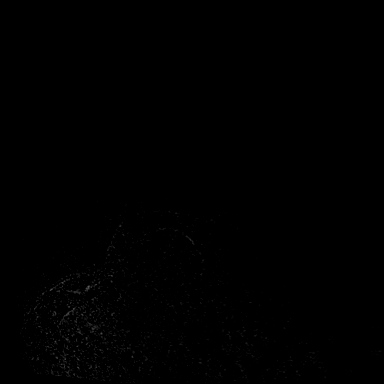
[im 40/160]
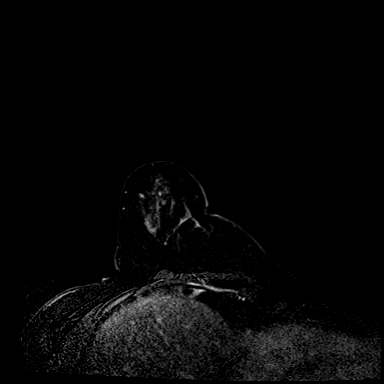
[im 80/160]
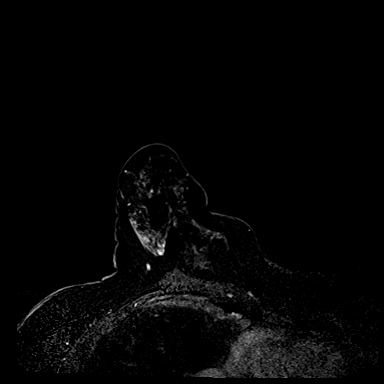
[im 120/160]
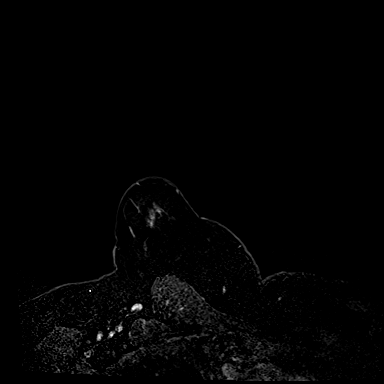
[im 160/160]
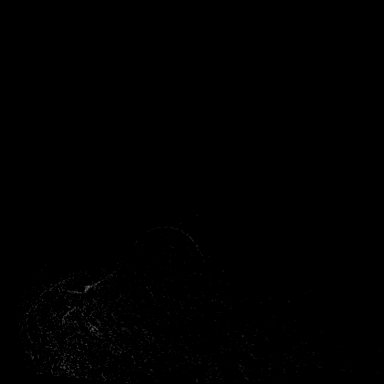

[Series 8: needle confirmation · axial · 1.3mm · 0.73mm/px · z∈[-81,+126]mm · 5 of 160 slices shown]
[im 1/160]
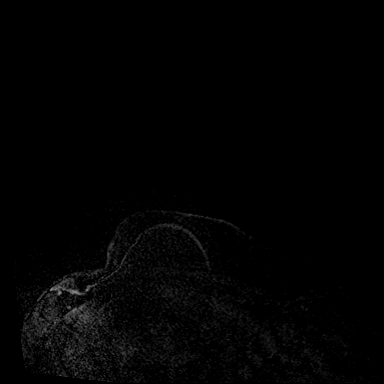
[im 40/160]
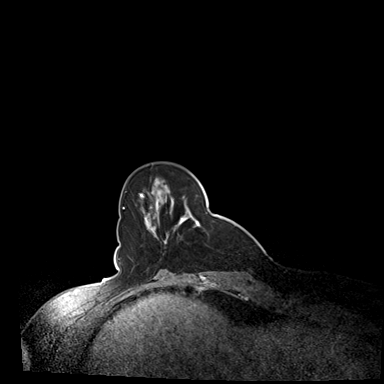
[im 80/160]
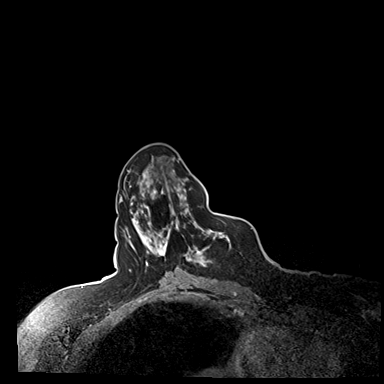
[im 120/160]
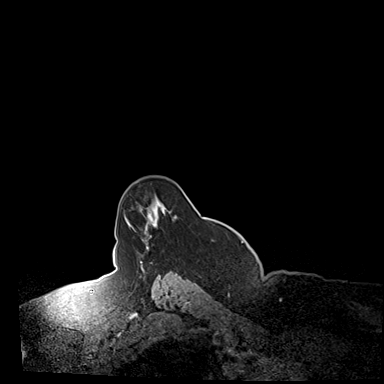
[im 160/160]
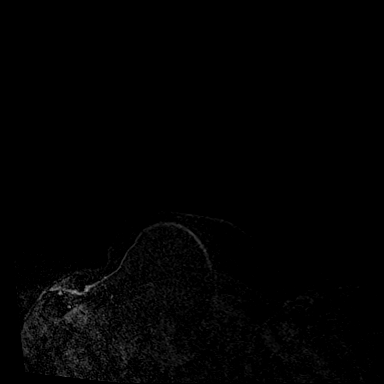

[33 of 48 positions shown; findings below may reference images not displayed]

FINDINGS: I met with the patient, and we discussed the procedure of MRI guided
biopsy, including risks, benefits, and alternatives. Specifically,
we discussed the risks of infection, bleeding, tissue injury, clip
migration, and inadequate sampling. Informed, written consent was
given. The usual time out protocol was performed immediately prior
to the procedure.

Using sterile technique, 1% Lidocaine, MRI guidance, and a 9 gauge
vacuum assisted device, biopsy was performed of non mass enhancement
in the upper outer right breast using a lateral approach. At the
conclusion of the procedure, a a barbell tissue marker clip was
deployed into the biopsy cavity. Follow-up 2-view mammogram was
performed and dictated separately.
IMPRESSION: MRI guided biopsy of non mass enhancement in the upper outer right
breast. No apparent complications.

ADDENDUM:
Pathology revealed INTERMEDIATE GRADE MAMMARY CARCINOMA IN SITU WITH
FOCAL NECROSIS of the RIGHT breast, upper outer.
Immunohistochemistry for E-Cadherin shows the carcinoma in situ is
strongly positive consistent with ductal carcinoma in situ. This was
found to be concordant by Dr. SEIXAS.

Pathology results were discussed with the patient by telephone. The
patient reported doing well after the biopsy with tenderness at the
site. Post biopsy instructions and care were reviewed and questions
were answered. The patient was encouraged to call The [REDACTED] for any additional concerns. My direct phone
number was provided.

The patient has a recent diagnosis of LEFT breast cancer and should
follow her outlined treatment plan.

Dr. SEIXAS was notified of biopsy results via [REDACTED] message on
[DATE].

Pathology results reported by SEIXAS, RN on [DATE].

*** End of Addendum ***
FINDINGS: I met with the patient, and we discussed the procedure of MRI guided
biopsy, including risks, benefits, and alternatives. Specifically,
we discussed the risks of infection, bleeding, tissue injury, clip
migration, and inadequate sampling. Informed, written consent was
given. The usual time out protocol was performed immediately prior
to the procedure.

Using sterile technique, 1% Lidocaine, MRI guidance, and a 9 gauge
vacuum assisted device, biopsy was performed of non mass enhancement
in the upper outer right breast using a lateral approach. At the
conclusion of the procedure, a a barbell tissue marker clip was
deployed into the biopsy cavity. Follow-up 2-view mammogram was
performed and dictated separately.
IMPRESSION: MRI guided biopsy of non mass enhancement in the upper outer right
breast. No apparent complications.

## 2020-10-24 MED ORDER — GADOBUTROL 1 MMOL/ML IV SOLN
9.0000 mL | Freq: Once | INTRAVENOUS | Status: AC | PRN
  Administered 2020-10-24: 9 mL via INTRAVENOUS

## 2020-10-25 ENCOUNTER — Encounter: Payer: Self-pay | Admitting: *Deleted

## 2020-11-08 ENCOUNTER — Institutional Professional Consult (permissible substitution): Payer: Federal, State, Local not specified - PPO | Admitting: Plastic Surgery

## 2020-11-10 ENCOUNTER — Encounter: Payer: Self-pay | Admitting: *Deleted

## 2020-11-10 ENCOUNTER — Telehealth: Payer: Self-pay | Admitting: *Deleted

## 2020-11-10 NOTE — Telephone Encounter (Signed)
Left message for a return phone call to let me know if she is transferring her care to [facility].  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

## 2020-11-11 ENCOUNTER — Telehealth: Payer: Self-pay | Admitting: *Deleted

## 2020-11-11 ENCOUNTER — Encounter: Payer: Self-pay | Admitting: *Deleted

## 2020-11-11 NOTE — Telephone Encounter (Signed)
Received call back from patient and she left me a message that she is transferring all care to [facility] and requested that I cancel her appointments with Dr.Iruku so those have been cancelled. Msg sent to team.

## 2020-11-23 ENCOUNTER — Other Ambulatory Visit: Payer: Federal, State, Local not specified - PPO

## 2020-11-23 ENCOUNTER — Ambulatory Visit: Payer: Federal, State, Local not specified - PPO | Admitting: Hematology and Oncology
